# Patient Record
Sex: Male | Born: 1945 | Race: White | Hispanic: No | Marital: Single | Smoking: Former smoker
Health system: Southern US, Community
[De-identification: ages and names within clinical notes are randomized; demographics above are authoritative.]

## PROBLEM LIST (undated history)

## (undated) DIAGNOSIS — M1990 Unspecified osteoarthritis, unspecified site: Secondary | ICD-10-CM

## (undated) DIAGNOSIS — I2510 Atherosclerotic heart disease of native coronary artery without angina pectoris: Secondary | ICD-10-CM

## (undated) DIAGNOSIS — E785 Hyperlipidemia, unspecified: Secondary | ICD-10-CM

## (undated) DIAGNOSIS — E669 Obesity, unspecified: Secondary | ICD-10-CM

## (undated) DIAGNOSIS — E119 Type 2 diabetes mellitus without complications: Secondary | ICD-10-CM

## (undated) DIAGNOSIS — I639 Cerebral infarction, unspecified: Secondary | ICD-10-CM

## (undated) DIAGNOSIS — I10 Essential (primary) hypertension: Secondary | ICD-10-CM

## (undated) DIAGNOSIS — I219 Acute myocardial infarction, unspecified: Secondary | ICD-10-CM

## (undated) DIAGNOSIS — G4730 Sleep apnea, unspecified: Secondary | ICD-10-CM

## (undated) HISTORY — DX: Essential (primary) hypertension: I10

## (undated) HISTORY — PX: HERNIA REPAIR: SHX51

## (undated) HISTORY — PX: KNEE SURGERY: SHX244

## (undated) HISTORY — DX: Atherosclerotic heart disease of native coronary artery without angina pectoris: I25.10

## (undated) HISTORY — DX: Type 2 diabetes mellitus without complications: E11.9

## (undated) HISTORY — DX: Hyperlipidemia, unspecified: E78.5

## (undated) HISTORY — PX: SHOULDER SURGERY: SHX246

## (undated) HISTORY — DX: Obesity, unspecified: E66.9

---

## 2012-05-04 ENCOUNTER — Encounter: Payer: Self-pay | Admitting: Cardiovascular Disease

## 2012-05-04 ENCOUNTER — Encounter: Payer: Self-pay | Admitting: *Deleted

## 2012-05-07 ENCOUNTER — Ambulatory Visit (INDEPENDENT_AMBULATORY_CARE_PROVIDER_SITE_OTHER): Payer: Medicare Other | Admitting: Cardiovascular Disease

## 2012-05-07 ENCOUNTER — Encounter: Payer: Self-pay | Admitting: Cardiovascular Disease

## 2012-05-07 VITALS — BP 174/82 | HR 66 | Wt 249.0 lb

## 2012-05-07 DIAGNOSIS — E782 Mixed hyperlipidemia: Secondary | ICD-10-CM

## 2012-05-07 DIAGNOSIS — E119 Type 2 diabetes mellitus without complications: Secondary | ICD-10-CM | POA: Insufficient documentation

## 2012-05-07 DIAGNOSIS — E669 Obesity, unspecified: Secondary | ICD-10-CM | POA: Insufficient documentation

## 2012-05-07 DIAGNOSIS — I2510 Atherosclerotic heart disease of native coronary artery without angina pectoris: Secondary | ICD-10-CM | POA: Insufficient documentation

## 2012-05-07 DIAGNOSIS — I10 Essential (primary) hypertension: Secondary | ICD-10-CM | POA: Insufficient documentation

## 2012-05-07 NOTE — Progress Notes (Signed)
Patient ID: Larry Hodges, male   DOB: 02/09/1946, 65 y.o.   MRN: [id number] 65 yo referred by [facility] for cardiology F/U.  CRF;s include HTN, DM, elevated lipids.  Larry York NP and [name] MD History of moderate CAD by cath 2005 in [place].  NO stent or PCI.  No chest pain but has diaphoresis and exertional dyspnea.  Last ETT was 2007 he indicates ok.  DM poorly controlled. Diet with too much bread.  Sedentary with no exercise.  Does some yard work.  Compliant with meds 88 pack year smoking history and quit in 2005.  Last ortho surgery 2008 left rotator cuff with no cardiac complications.  His new patient form indicates reaction to dye but did not have any problem at cath and was not premedicated   ROS: Denies fever, malais, weight loss, blurry vision, decreased visual acuity, cough, sputum, SOB, hemoptysis, pleuritic pain, palpitaitons, heartburn, abdominal pain, melena, lower extremity edema, claudication, or rash.  All other systems reviewed and negative   General: Affect appropriate Obese white male HEENT: normal Neck supple with no adenopathy JVP normal no bruits no thyromegaly Lungs clear with no wheezing and good diaphragmatic motion Heart:  S1/S2 no murmur,rub, gallop or click PMI normal Abdomen: benighn, BS positve, no tenderness, no AAA no bruit.  No HSM or HJR Distal pulses intact with no bruits No edema Neuro non-focal Skin warm and dry No muscular weakness  Medications Current Outpatient Prescriptions  Medication Sig Dispense Refill  . aspirin 81 MG tablet Take 81 mg by mouth daily.      . clopidogrel (PLAVIX) 75 MG tablet Take 75 mg by mouth daily.      . pravastatin (PRAVACHOL) 40 MG tablet Take 40 mg by mouth daily.        Allergies Review of patient's allergies indicates no known allergies.  Family History: Family History  Problem Relation Age of Onset  . Hypertension    . Kidney disease    . Hypertension Mother   . Kidney disease  Father     Social History: History   Social History  . Marital Status: Single    Spouse Name: N/A    Number of Children: N/A  . Years of Education: N/A   Occupational History  . Not on file.   Social History Main Topics  . Smoking status: Former Smoker    Types: Cigarettes    Quit date: 12/01/2003  . Smokeless tobacco: Never Used  . Alcohol Use: Yes  . Drug Use:   . Sexually Active:    Other Topics Concern  . Not on file   Social History Narrative  . No narrative on file    Electrocardiogram: NSR rate 66 LAD IVCD no old tracing  Assessment and Plan   

## 2012-05-07 NOTE — Assessment & Plan Note (Signed)
Well controlled.  Continue current medications and low sodium Dash type diet.    

## 2012-05-07 NOTE — Assessment & Plan Note (Signed)
Multiple CRF;s with DM and exertinal diaphoresis and dyspnea.  Moderate CAD in 2005.  Needs stress test to begin exercise program Given IVCD on ECG wll need imaging  Stress myovue may need combo of Lexiscan and low level exercise.  Hold beta blocker before scan

## 2012-05-07 NOTE — Assessment & Plan Note (Signed)
Cholesterol is at goal.  Continue current dose of statin and diet Rx.  No myalgias or side effects.  F/U  LFT's in 6 months. No results found for this basename: LDLCALC             

## 2012-05-07 NOTE — Patient Instructions (Signed)
Your physician wants you to follow-up in: 6 MONTHS WITH DR NISHAN You will receive a reminder letter in the mail two months in advance. If you don't receive a letter, please call our office to schedule the follow-up appointment. Your physician recommends that you continue on your current medications as directed. Please refer to the Current Medication list given to you today. Your physician has requested that you have en exercise stress myoview. For further information please visit [email or web address]. Please follow instruction sheet, as given. DX CAD 

## 2012-05-07 NOTE — Assessment & Plan Note (Signed)
Long discussion regarding lifestyle diet and need to exercise.  Also discussed bariatric surgery.  Will see what stress test looks like and what his weight dose in 6 months

## 2012-05-07 NOTE — Assessment & Plan Note (Signed)
Discussed low carb diet.  Target hemoglobin A1c is 6.5 or less.  Continue current medications.  

## 2012-05-10 ENCOUNTER — Encounter: Payer: Self-pay | Admitting: Cardiovascular Disease

## 2012-05-14 ENCOUNTER — Ambulatory Visit (HOSPITAL_COMMUNITY): Payer: Medicare Other | Attending: Cardiovascular Disease | Admitting: Radiology

## 2012-05-14 VITALS — BP 136/81 | HR 65 | Ht 67.0 in | Wt 249.0 lb

## 2012-05-14 DIAGNOSIS — R9439 Abnormal result of other cardiovascular function study: Secondary | ICD-10-CM | POA: Insufficient documentation

## 2012-05-14 DIAGNOSIS — R0602 Shortness of breath: Secondary | ICD-10-CM

## 2012-05-14 DIAGNOSIS — I779 Disorder of arteries and arterioles, unspecified: Secondary | ICD-10-CM | POA: Insufficient documentation

## 2012-05-14 DIAGNOSIS — Z87891 Personal history of nicotine dependence: Secondary | ICD-10-CM | POA: Insufficient documentation

## 2012-05-14 DIAGNOSIS — E119 Type 2 diabetes mellitus without complications: Secondary | ICD-10-CM

## 2012-05-14 DIAGNOSIS — I10 Essential (primary) hypertension: Secondary | ICD-10-CM

## 2012-05-14 DIAGNOSIS — E669 Obesity, unspecified: Secondary | ICD-10-CM | POA: Insufficient documentation

## 2012-05-14 DIAGNOSIS — I2510 Atherosclerotic heart disease of native coronary artery without angina pectoris: Secondary | ICD-10-CM

## 2012-05-14 DIAGNOSIS — Z8249 Family history of ischemic heart disease and other diseases of the circulatory system: Secondary | ICD-10-CM | POA: Insufficient documentation

## 2012-05-14 DIAGNOSIS — E785 Hyperlipidemia, unspecified: Secondary | ICD-10-CM | POA: Insufficient documentation

## 2012-05-14 DIAGNOSIS — R0989 Other specified symptoms and signs involving the circulatory and respiratory systems: Secondary | ICD-10-CM | POA: Insufficient documentation

## 2012-05-14 DIAGNOSIS — E782 Mixed hyperlipidemia: Secondary | ICD-10-CM

## 2012-05-14 DIAGNOSIS — R0609 Other forms of dyspnea: Secondary | ICD-10-CM | POA: Insufficient documentation

## 2012-05-14 DIAGNOSIS — Z8673 Personal history of transient ischemic attack (TIA), and cerebral infarction without residual deficits: Secondary | ICD-10-CM | POA: Insufficient documentation

## 2012-05-14 MED ORDER — TECHNETIUM TC 99M TETROFOSMIN IV KIT
33.0000 | PACK | Freq: Once | INTRAVENOUS | Status: AC | PRN
  Administered 2012-05-14: 33 via INTRAVENOUS

## 2012-05-14 MED ORDER — TECHNETIUM TC 99M TETROFOSMIN IV KIT
11.0000 | PACK | Freq: Once | INTRAVENOUS | Status: AC | PRN
  Administered 2012-05-14: 11 via INTRAVENOUS

## 2012-05-14 NOTE — Progress Notes (Signed)
[facility] SITE 3 NUCLEAR MED [address] [place] [address] [phone]  Cardiology Nuclear Med Study  Larry Hodges is a 66 y.o. male     MRN : [id number]     DOB: [birth year]  Procedure Date: 05/14/2012  Nuclear Med Background Indication for Stress Test:  Evaluation for Ischemia History:  '05 MI>Cath([place]):Moderate N/O CAD; '07 MPS([place]):Normal, per patient Cardiac Risk Factors: Carotid Disease (per patient-slight on left), Family History - CAD, History of Smoking, Hypertension, Lipids, NIDDM, TIA (per patient) and Obesity  Symptoms:  DOE   Nuclear Pre-Procedure Caffeine/Decaff Intake:  None NPO After: 10:00pm   Lungs:  Clear. O2 Sat: 98% on room air. IV 0.9% NS with Angio Cath:  20g  IV Site: R Wrist  IV Started by:  Stanton Kidney, EMT-P  Chest Size (in):  54 Cup Size: n/a  Height: 5\' 7"  (1.702 m)  Weight:  249 lb (112.946 kg)  BMI:  Body mass index is 39.00 kg/(m^2). Tech Comments:  Metoprolol held > 48 hours, per patient. CBG= 157 @ 7am, per patient.    Nuclear Med Study 1 or 2 day study: 1 day  Stress Test Type:  Stress  Reading MD: Olga Millers, MD  Order Authorizing Provider:  [name], MD  Resting Radionuclide: Technetium 12m Tetrofosmin  Resting Radionuclide Dose: 10.6 mCi   Stress Radionuclide:  Technetium 43m Tetrofosmin  Stress Radionuclide Dose: 32.1 mCi           Stress Protocol Rest HR: 65 Stress HR: 125  Rest BP: 136/81 Stress BP: 197/56  Exercise Time (min): 4:39 METS: 5.9   Predicted Max HR: 156 bpm % Max HR: 80.13 bpm Rate Pressure Product: 46962   Dose of Adenosine (mg):  n/a Dose of Lexiscan: n/a mg  Dose of Atropine (mg): n/a Dose of Dobutamine: n/a mcg/kg/min (at max HR)  Stress Test Technologist: Smiley Houseman, CMA-N  Nuclear Technologist:  Domenic Polite, CNMT     Rest Procedure:  Myocardial perfusion imaging was performed at rest 45 minutes following the intravenous administration of Technetium 71m  Tetrofosmin.  Rest ECG: NSIVCD with nonspecific T-wave changes.  Stress Procedure:  The patient exercised on the treadmill utilizing the Bruce protocol for 4:39 minutes. He then stopped due to fatigue and dyspnea with peak O2 sat of 91%.  He denied any chest pain.  There were no diagnostic ST-T wave changes, occasional PAC's/PVC's were noted.  Technetium 44m Tetrofosmin was injected at peak exercise and myocardial perfusion imaging was performed after a brief delay.  Stress ECG: No significant ST segment change suggestive of ischemia.  QPS Raw Data Images:  Acquisition technically good; mild LVE. Stress Images:  There is decreased uptake in the inferior wall and apex. Rest Images:  There is decreased uptake in the inferior wall and apex; inferior defect less prominent compared to the stress images. Subtraction (SDS):  These findings are consistent with prior inferior and apical infarct and mild ischemia in the inferior wall. Transient Ischemic Dilatation (Normal <1.22):  0.98 Lung/Heart Ratio (Normal <0.45):  0.42  Quantitative Gated Spect Images QGS EDV:  120 ml QGS ESV:  58 ml  Impression Exercise Capacity:  Poor exercise capacity. BP Response:  Normal blood pressure response. Clinical Symptoms:  There is dyspnea. ECG Impression:  No significant ST segment change suggestive of ischemia. Comparison with Prior Nuclear Study: No images to compare  Overall Impression:  Abnormal stress nuclear study with a medium size, moderate intensity, partially reversible inferior and apical  defect consistent with prior inferior and apical infarct and mild peri-infarct ischemia in the inferior wall.  LV Ejection Fraction: 52%.  LV Wall Motion:  Normal Wall Motion  Olga Millers

## 2012-05-17 NOTE — Patient Instructions (Addendum)
Pt was scheduled for 6/28

## 2012-05-18 ENCOUNTER — Other Ambulatory Visit: Payer: Self-pay | Admitting: *Deleted

## 2012-05-18 ENCOUNTER — Telehealth: Payer: Self-pay | Admitting: Cardiovascular Disease

## 2012-05-18 ENCOUNTER — Encounter: Payer: Self-pay | Admitting: *Deleted

## 2012-05-18 DIAGNOSIS — I10 Essential (primary) hypertension: Secondary | ICD-10-CM

## 2012-05-18 DIAGNOSIS — Z01810 Encounter for preprocedural cardiovascular examination: Secondary | ICD-10-CM

## 2012-05-18 DIAGNOSIS — I2510 Atherosclerotic heart disease of native coronary artery without angina pectoris: Secondary | ICD-10-CM

## 2012-05-18 MED ORDER — FAMOTIDINE 10 MG PO CHEW: 10.0000 mg | CHEWABLE_TABLET | ORAL | Status: DC

## 2012-05-18 MED ORDER — PREDNISONE 20 MG PO TABS: 20.0000 mg | ORAL_TABLET | Freq: Every day | ORAL | Status: DC

## 2012-05-18 MED ORDER — PREDNISONE 20 MG PO TABS: 20.0000 mg | ORAL_TABLET | Freq: Every day | ORAL | Status: AC

## 2012-05-18 NOTE — Telephone Encounter (Signed)
PT AWARE OF TEST RESULTS  CATH SET UP FOR 05-25-12 AT 8:30 WITH DR NISHAN./CY

## 2012-05-18 NOTE — Telephone Encounter (Signed)
New problem:  Patient calling regarding test results.  

## 2012-05-20 ENCOUNTER — Other Ambulatory Visit: Payer: Self-pay | Admitting: Cardiovascular Disease

## 2012-05-21 ENCOUNTER — Other Ambulatory Visit: Payer: Medicare Other

## 2012-05-21 ENCOUNTER — Other Ambulatory Visit (INDEPENDENT_AMBULATORY_CARE_PROVIDER_SITE_OTHER): Payer: Medicare Other

## 2012-05-21 DIAGNOSIS — I2510 Atherosclerotic heart disease of native coronary artery without angina pectoris: Secondary | ICD-10-CM

## 2012-05-21 DIAGNOSIS — Z01810 Encounter for preprocedural cardiovascular examination: Secondary | ICD-10-CM

## 2012-05-21 DIAGNOSIS — I10 Essential (primary) hypertension: Secondary | ICD-10-CM

## 2012-05-21 LAB — CBC WITH DIFFERENTIAL/PLATELET
Basophils Relative: 0.8 % (ref 0.0–3.0)
Eosinophils Absolute: 0.3 10*3/uL (ref 0.0–0.7)
HCT: 41.3 % (ref 39.0–52.0)
Hemoglobin: 13.4 g/dL (ref 13.0–17.0)
Lymphs Abs: 1.9 10*3/uL (ref 0.7–4.0)
MCHC: 32.3 g/dL (ref 30.0–36.0)
MCV: 92.3 fl (ref 78.0–100.0)
Monocytes Absolute: 0.5 10*3/uL (ref 0.1–1.0)
Neutro Abs: 6 10*3/uL (ref 1.4–7.7)
Neutrophils Relative %: 68.6 % (ref 43.0–77.0)
RBC: 4.47 Mil/uL (ref 4.22–5.81)

## 2012-05-21 LAB — PROTIME-INR: INR: 0.9 ratio (ref 0.8–1.0)

## 2012-05-21 LAB — BASIC METABOLIC PANEL
CO2: 27 mEq/L (ref 19–32)
Chloride: 103 mEq/L (ref 96–112)
Creatinine, Ser: 1 mg/dL (ref 0.4–1.5)

## 2012-05-25 ENCOUNTER — Inpatient Hospital Stay (HOSPITAL_BASED_OUTPATIENT_CLINIC_OR_DEPARTMENT_OTHER)
Admission: RE | Admit: 2012-05-25 | Discharge: 2012-05-25 | Disposition: A | Discharge status: Home / Self Care | Payer: Medicare Other | Source: Ambulatory Visit | Attending: Cardiovascular Disease | Admitting: Cardiovascular Disease

## 2012-05-25 ENCOUNTER — Encounter (HOSPITAL_BASED_OUTPATIENT_CLINIC_OR_DEPARTMENT_OTHER)
Admission: RE | Disposition: A | Discharge status: Home / Self Care | Payer: Self-pay | Source: Ambulatory Visit | Attending: Cardiovascular Disease

## 2012-05-25 DIAGNOSIS — E785 Hyperlipidemia, unspecified: Secondary | ICD-10-CM | POA: Insufficient documentation

## 2012-05-25 DIAGNOSIS — I2510 Atherosclerotic heart disease of native coronary artery without angina pectoris: Secondary | ICD-10-CM

## 2012-05-25 DIAGNOSIS — R9439 Abnormal result of other cardiovascular function study: Secondary | ICD-10-CM | POA: Insufficient documentation

## 2012-05-25 DIAGNOSIS — Q245 Malformation of coronary vessels: Secondary | ICD-10-CM | POA: Insufficient documentation

## 2012-05-25 DIAGNOSIS — I10 Essential (primary) hypertension: Secondary | ICD-10-CM | POA: Insufficient documentation

## 2012-05-25 DIAGNOSIS — E119 Type 2 diabetes mellitus without complications: Secondary | ICD-10-CM | POA: Insufficient documentation

## 2012-05-25 LAB — POCT I-STAT GLUCOSE: Glucose, Bld: 162 mg/dL — ABNORMAL HIGH (ref 70–99)

## 2012-05-25 SURGERY — JV LEFT HEART CATHETERIZATION WITH CORONARY ANGIOGRAM
Anesthesia: Moderate Sedation

## 2012-05-25 MED ORDER — SODIUM CHLORIDE 0.9 % IJ SOLN: 3.0000 mL | INTRAMUSCULAR | Status: DC | PRN

## 2012-05-25 MED ORDER — SODIUM CHLORIDE 0.9 % IJ SOLN: 3.0000 mL | Freq: Two times a day (BID) | INTRAMUSCULAR | Status: DC

## 2012-05-25 MED ORDER — ASPIRIN 81 MG PO CHEW: 324.0000 mg | CHEWABLE_TABLET | ORAL | Status: DC

## 2012-05-25 MED ORDER — SODIUM CHLORIDE 0.45 % IV SOLN: INTRAVENOUS | Status: AC

## 2012-05-25 MED ORDER — SODIUM CHLORIDE 0.9 % IV SOLN: 250.0000 mL | INTRAVENOUS | Status: DC

## 2012-05-25 MED ORDER — SODIUM CHLORIDE 0.9 % IV SOLN
250.0000 mL | INTRAVENOUS | Status: DC | PRN
  Administered 2012-05-25: 250 mL via INTRAVENOUS

## 2012-05-25 MED ORDER — OXYCODONE-ACETAMINOPHEN 5-325 MG PO TABS
1.0000 | ORAL_TABLET | ORAL | Status: DC | PRN
Start: 2012-05-25 — End: 2012-05-25

## 2012-05-25 MED ORDER — ASPIRIN 325 MG PO TABS
325.0000 mg | ORAL_TABLET | Freq: Once | ORAL | Status: AC
  Administered 2012-05-25: 325 mg via ORAL

## 2012-05-25 MED ORDER — ISOSORBIDE MONONITRATE ER 30 MG PO TB24: 30.0000 mg | ORAL_TABLET | Freq: Every day | ORAL | Status: DC

## 2012-05-25 MED ORDER — ACETAMINOPHEN 325 MG PO TABS: 650.0000 mg | ORAL_TABLET | ORAL | Status: DC | PRN

## 2012-05-25 MED ORDER — ASPIRIN EC 325 MG PO TBEC: 325.0000 mg | DELAYED_RELEASE_TABLET | Freq: Every day | ORAL | Status: DC

## 2012-05-25 MED ORDER — ONDANSETRON HCL 4 MG/2ML IJ SOLN: 4.0000 mg | Freq: Four times a day (QID) | INTRAMUSCULAR | Status: DC | PRN

## 2012-05-25 NOTE — H&P (View-Only) (Signed)
Patient ID: Larry Hodges, male   DOB: 01-23-1946, 66 y.o.   MRN: [id number] 66 yo referred by [facility] for cardiology F/U.  CRF;s include HTN, DM, elevated lipids.  Mauricio Po NP and Marcellus Scott MD History of moderate CAD by cath 2005 in [place].  NO stent or PCI.  No chest pain but has diaphoresis and exertional dyspnea.  Last ETT was 2007 he indicates ok.  DM poorly controlled. Diet with too much bread.  Sedentary with no exercise.  Does some yard work.  Compliant with meds 88 pack year smoking history and quit in 2005.  Last ortho surgery 2008 left rotator cuff with no cardiac complications.  His new patient form indicates reaction to dye but did not have any problem at cath and was not premedicated   ROS: Denies fever, malais, weight loss, blurry vision, decreased visual acuity, cough, sputum, SOB, hemoptysis, pleuritic pain, palpitaitons, heartburn, abdominal pain, melena, lower extremity edema, claudication, or rash.  All other systems reviewed and negative   General: Affect appropriate Obese white male HEENT: normal Neck supple with no adenopathy JVP normal no bruits no thyromegaly Lungs clear with no wheezing and good diaphragmatic motion Heart:  S1/S2 no murmur,rub, gallop or click PMI normal Abdomen: benighn, BS positve, no tenderness, no AAA no bruit.  No HSM or HJR Distal pulses intact with no bruits No edema Neuro non-focal Skin warm and dry No muscular weakness  Medications Current Outpatient Prescriptions  Medication Sig Dispense Refill  . aspirin 81 MG tablet Take 81 mg by mouth daily.      . clopidogrel (PLAVIX) 75 MG tablet Take 75 mg by mouth daily.      . pravastatin (PRAVACHOL) 40 MG tablet Take 40 mg by mouth daily.        Allergies Review of patient's allergies indicates no known allergies.  Family History: Family History  Problem Relation Age of Onset  . Hypertension    . Kidney disease    . Hypertension Mother   . Kidney disease  Father     Social History: History   Social History  . Marital Status: Single    Spouse Name: N/A    Number of Children: N/A  . Years of Education: N/A   Occupational History  . Not on file.   Social History Main Topics  . Smoking status: Former Smoker    Types: Cigarettes    Quit date: 12/01/2003  . Smokeless tobacco: Never Used  . Alcohol Use: Yes  . Drug Use:   . Sexually Active:    Other Topics Concern  . Not on file   Social History Narrative  . No narrative on file    Electrocardiogram: NSR rate 66 LAD IVCD no old tracing  Assessment and Plan

## 2012-05-25 NOTE — Interval H&P Note (Signed)
History and Physical Interval Note:  05/25/2012 8:03 AM  Larry Hodges  has presented today for surgery, with the diagnosis of chest pain  The various methods of treatment have been discussed with the patient and family. After consideration of risks, benefits and other options for treatment, the patient has consented to  Procedure(s) (LRB): JV LEFT HEART CATHETERIZATION WITH CORONARY ANGIOGRAM (N/A) as a surgical intervention .  The patient's history has been reviewed, patient examined, no change in status, stable for surgery.  I have reviewed the patients' chart and labs.  Questions were answered to the patient's satisfaction.     [name]

## 2012-05-25 NOTE — CV Procedure (Signed)
      Catheterization   Indication: Diabetic with abnormal myovue  Procedure: After informed consent and clinical "time out" the right groin was prepped and draped in a sterile fashion.  A 5Fr sheath was placed in the right femoral artery using seldinger technique and local lidocaine.  Standard JL4, JR4 and angled pigtail catheters were used to engage the coronary arteries.  Coronary arteries were visualized in orthogonal views using caudal and cranial angulation.  RAO ventriculography was done using 28* cc of contrast.    Medications:   Versed: 2 mg's  Fentanyl: 0 ug's  Coronary Arteries: Right dominant with no anomalies  LM: 30% mid lesion  LAD: 30-40% ostial.  Long mid/distal segment of ? Myocardial bridging.  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  Medical Rx  Better control of DM And add nitrates.  [name] 05/25/2012 9:01 AM

## 2012-05-25 NOTE — Discharge Instructions (Signed)
F/U with Dr Eden Emms 8 weeks Start Imdur 30 mg daily Script called in start over weekend

## 2012-06-06 ENCOUNTER — Encounter: Payer: Medicare Other | Admitting: Physician Assistant

## 2012-06-14 ENCOUNTER — Encounter: Payer: Medicare Other | Admitting: Physician Assistant

## 2012-06-19 ENCOUNTER — Ambulatory Visit (INDEPENDENT_AMBULATORY_CARE_PROVIDER_SITE_OTHER): Payer: Medicare Other | Admitting: Physician Assistant

## 2012-06-19 ENCOUNTER — Encounter: Payer: Self-pay | Admitting: Physician Assistant

## 2012-06-19 ENCOUNTER — Other Ambulatory Visit: Payer: Self-pay | Admitting: *Deleted

## 2012-06-19 VITALS — BP 107/51 | HR 57 | Ht 67.0 in | Wt 248.0 lb

## 2012-06-19 DIAGNOSIS — I10 Essential (primary) hypertension: Secondary | ICD-10-CM

## 2012-06-19 DIAGNOSIS — I2510 Atherosclerotic heart disease of native coronary artery without angina pectoris: Secondary | ICD-10-CM

## 2012-06-19 DIAGNOSIS — E785 Hyperlipidemia, unspecified: Secondary | ICD-10-CM

## 2012-06-19 DIAGNOSIS — E669 Obesity, unspecified: Secondary | ICD-10-CM

## 2012-06-19 DIAGNOSIS — E119 Type 2 diabetes mellitus without complications: Secondary | ICD-10-CM

## 2012-06-19 NOTE — Progress Notes (Signed)
[address] [place], [place]  [address] Phone: [phone] Fax:  [phone]  Date:  06/19/2012   Name:  Larry Hodges   DOB:  May 11, 1946   MRN:  [id number]  PCP:  Dema Severin, NP  Primary Cardiologist:  Dr. [name]  Primary Electrophysiologist:  None    History of Present Illness: Larry Hodges is a 66 y.o. male who returns for post cath follow up.  He has a history of DM2, HTN, HL, CAD.  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  He establish with Dr. Eden Emms 05/07/12.  He denied chest pain but did have some exertional dyspnea.  Follow up Myoview performed 05/14/12 was abnormal: inferior and apical defect consistent with prior inferior and apical infarct and mild peri-infarct ischemia, EF 52%.  Therefore, cardiac catheterization was recommended.  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  Dr. Eden Emms added isosorbide 30 mg daily given his myocardial bridging and evidence of diffuse distal diabetic disease on his cardiac catheterization.  The patient denies chest pain, shortness of breath, syncope, orthopnea, PND or significant pedal edema.  Tolerating isosorbide well.    Past Medical History  Diagnosis Date  . DM (diabetes mellitus)   . HTN (hypertension)   . Hyperlipidemia   . CAD (coronary artery disease)     remote MI 2005 in [place] - no PCI;  Myoview performed 05/14/12 was abnormal: inferior and apical defect consistent with prior inferior and apical infarct and mild peri-infarct ischemia, EF 52%.  Therefore, cardiac catheterization was recommended.  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 -  med Rx  . Obesity     Current Outpatient Prescriptions  Medication Sig Dispense Refill  . aspirin 81 MG  tablet Take 81 mg by mouth daily.      . clopidogrel (PLAVIX) 75 MG tablet Take 75 mg by mouth daily.      . hydrochlorothiazide (MICROZIDE) 12.5 MG capsule Take 12.5 mg by mouth daily.      . isosorbide mononitrate (IMDUR) 30 MG 24 hr tablet Take 1 tablet (30 mg total) by mouth daily.  30 tablet  11  . lisinopril (PRINIVIL,ZESTRIL) 20 MG tablet Take 20 mg by mouth daily.      . metFORMIN (GLUCOPHAGE) 1000 MG tablet Take 1,000 mg by mouth 2 (two) times daily with a meal.      . metoprolol (LOPRESSOR) 50 MG tablet Take 50 mg by mouth 2 (two) times daily.      . pravastatin (PRAVACHOL) 40 MG tablet Take 40 mg by mouth daily.        Allergies: No Known Allergies  History  Substance Use Topics  . Smoking status: Former Smoker    Types: Cigarettes    Quit date: 12/01/2003  . Smokeless tobacco: Never Used  . Alcohol Use: Yes     PHYSICAL EXAM: VS:  BP 107/51  Pulse 57  Ht 5\' 7"  (1.702 m)  Wt 248 lb (112.492 kg)  BMI 38.84 kg/m2 Well nourished, well developed, in no acute distress HEENT: normal Neck: no JVD Cardiac:  normal S1, S2; RRR; no murmur Lungs:  clear to auscultation bilaterally, no wheezing, rhonchi or rales Abd: soft, nontender, no hepatomegaly Ext: trace bilateral  LE edema; right groin without hematoma or bruit  Skin: warm and dry Neuro:  CNs 2-12 intact, no focal abnormalities noted  EKG:  Sinus rhythm, heart rate 60, left axis deviation, interventricular conduction delay, nonspecific ST-T wave changes, no change from prior tracing      ASSESSMENT AND PLAN:  1.  CAD Nonobstructive by cardiac catheterization.  He did have evidence of myocardial bridging of the mid to distal LAD as well as evidence of diffuse distal diabetic disease especially in the LAD which was small caliber.  He is tolerating isosorbide.  Continue aspirin and Plavix.  Continue to increase activity and monitor diabetes and hyperlipidemia with his PCP.  Follow up with Dr. Eden Emms in 6 months or sooner  as needed.  2.  Hypertension Controlled.  Continue current therapy.   3.  Hyperlipidemia Managed by PCP. Goal LDL </= 70.  4.  Diabetes mellitus Continued strict control per PCP.  5.  Obesity We discussed various options to lose weight and increase activity.    Signed, Tereso Newcomer, PA-C  1:13 PM 06/19/2012

## 2012-06-19 NOTE — Patient Instructions (Addendum)
Your physician wants you to follow-up in: 6 MONTHS WITH DR. NISHAN. You will receive a reminder letter in the mail two months in advance. If you don't receive a letter, please call our office to schedule the follow-up appointment.   NO CHANGES WERE MADE TODAY 

## 2012-11-06 ENCOUNTER — Ambulatory Visit (INDEPENDENT_AMBULATORY_CARE_PROVIDER_SITE_OTHER): Payer: Medicare Other | Admitting: Cardiovascular Disease

## 2012-11-06 ENCOUNTER — Encounter: Payer: Self-pay | Admitting: Cardiovascular Disease

## 2012-11-06 VITALS — BP 128/54 | HR 63 | Resp 16 | Ht 67.0 in | Wt 247.8 lb

## 2012-11-06 DIAGNOSIS — I10 Essential (primary) hypertension: Secondary | ICD-10-CM

## 2012-11-06 DIAGNOSIS — E782 Mixed hyperlipidemia: Secondary | ICD-10-CM

## 2012-11-06 DIAGNOSIS — E119 Type 2 diabetes mellitus without complications: Secondary | ICD-10-CM

## 2012-11-06 DIAGNOSIS — I2510 Atherosclerotic heart disease of native coronary artery without angina pectoris: Secondary | ICD-10-CM

## 2012-11-06 MED ORDER — NITROGLYCERIN 0.4 MG SL SUBL: 0.4000 mg | SUBLINGUAL_TABLET | SUBLINGUAL | Status: DC | PRN

## 2012-11-06 NOTE — Assessment & Plan Note (Signed)
Discussed low carb diet.  Target hemoglobin A1c is 6.5 or less.  Continue current medications.  

## 2012-11-06 NOTE — Patient Instructions (Signed)
Your physician wants you to follow-up in:  6 MONTHS WITH DR NISHAN  You will receive a reminder letter in the mail two months in advance. If you don't receive a letter, please call our office to schedule the follow-up appointment. Your physician recommends that you continue on your current medications as directed. Please refer to the Current Medication list given to you today. 

## 2012-11-06 NOTE — Assessment & Plan Note (Signed)
Cholesterol is at goal.  Continue current dose of statin and diet Rx.  No myalgias or side effects.  F/U  LFT's in 6 months. No results found for this basename: LDLCALC   Labs with primary continue statin

## 2012-11-06 NOTE — Progress Notes (Signed)
Patient ID: Larry Hodges, male   DOB: 10-30-1946, 66 y.o.   MRN: [id number] Larry Hodges is a 66 y.o. male who returns for post cath follow up.  He has a history of DM2, HTN, HL, CAD. 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 He establish with Dr. Eden Emms 05/07/12. He denied chest pain but did have some exertional dyspnea. Follow up Myoview performed 05/14/12 was abnormal: inferior and apical defect consistent with prior inferior and apical infarct and mild peri-infarct ischemia, EF 52%. Therefore, cardiac catheterization was recommended. 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 Isosorbide 30 mg daily given for  his myocardial bridging and evidence of diffuse distal diabetic disease on his cardiac catheterization.   The patient denies chest pain, shortness of breath, syncope, orthopnea, PND or significant pedal edema. Tolerating isosorbide well.  A1C about 6.5  Needs SL nitro  ROS: Denies fever, malais, weight loss, blurry vision, decreased visual acuity, cough, sputum, SOB, hemoptysis, pleuritic pain, palpitaitons, heartburn, abdominal pain, melena, lower extremity edema, claudication, or rash.  All other systems reviewed and negative  General: Affect appropriate Overweight white male HEENT: normal Neck supple with no adenopathy JVP normal no bruits no thyromegaly Lungs clear with no wheezing and good diaphragmatic motion Heart:  S1/S2 no murmur, no rub, gallop or click PMI normal Abdomen: benighn, BS positve, no tenderness, no AAA no bruit.  No HSM or HJR Distal pulses intact with no bruits Trace bilateral edema Neuro non-focal Skin warm and dry No muscular weakness   Current Outpatient Prescriptions  Medication Sig Dispense Refill  . aspirin 81 MG tablet Take 81 mg by mouth daily.      . clopidogrel (PLAVIX) 75 MG tablet Take 75  mg by mouth daily.      . hydrochlorothiazide (MICROZIDE) 12.5 MG capsule Take 12.5 mg by mouth daily.      . isosorbide mononitrate (IMDUR) 30 MG 24 hr tablet Take 1 tablet (30 mg total) by mouth daily.  30 tablet  11  . lisinopril (PRINIVIL,ZESTRIL) 20 MG tablet Take 30 mg by mouth daily.       . metFORMIN (GLUCOPHAGE) 1000 MG tablet Take 1,000 mg by mouth 2 (two) times daily with a meal.      . metoprolol (LOPRESSOR) 50 MG tablet Take 50 mg by mouth 2 (two) times daily.      . pravastatin (PRAVACHOL) 40 MG tablet Take 40 mg by mouth daily.        Allergies  Review of patient's allergies indicates no known allergies.  Electrocardiogram: 7/23 SR rate 60 LVH   Assessment and Plan

## 2012-11-06 NOTE — Assessment & Plan Note (Signed)
Stable with no angina and good activity level.  Continue medical Rx  

## 2012-11-06 NOTE — Assessment & Plan Note (Signed)
Well controlled.  Continue current medications and low sodium Dash type diet.    

## 2013-05-13 ENCOUNTER — Encounter: Payer: Self-pay | Admitting: Cardiovascular Disease

## 2013-07-09 ENCOUNTER — Encounter: Payer: Self-pay | Admitting: Cardiovascular Disease

## 2013-07-09 ENCOUNTER — Ambulatory Visit (INDEPENDENT_AMBULATORY_CARE_PROVIDER_SITE_OTHER): Payer: Medicare Other | Admitting: Cardiovascular Disease

## 2013-07-09 VITALS — BP 138/70 | HR 62 | Wt 240.0 lb

## 2013-07-09 DIAGNOSIS — I2510 Atherosclerotic heart disease of native coronary artery without angina pectoris: Secondary | ICD-10-CM

## 2013-07-09 DIAGNOSIS — R0989 Other specified symptoms and signs involving the circulatory and respiratory systems: Secondary | ICD-10-CM

## 2013-07-09 NOTE — Assessment & Plan Note (Signed)
Stable with no angina and good activity level.  Continue medical Rx  

## 2013-07-09 NOTE — Assessment & Plan Note (Signed)
Well controlled.  Continue current medications and low sodium Dash type diet.    

## 2013-07-09 NOTE — Assessment & Plan Note (Signed)
Discussed low carb diet.  Target hemoglobin A1c is 6.5 or less.  Continue current medications.  

## 2013-07-09 NOTE — Progress Notes (Signed)
Patient ID: Larry Hodges, male   DOB: 04/18/1946, 67 y.o.   MRN: [id number] Larry Hodges is a 67 y.o. male who returns for post cath follow up.  He has a history of DM2, HTN, HL, CAD. 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 He establish with Dr. Eden Emms 05/07/12. He denied chest pain but did have some exertional dyspnea. Follow up Myoview performed 05/14/12 was abnormal: inferior and apical defect consistent with prior inferior and apical infarct and mild peri-infarct ischemia, EF 52%. Therefore, cardiac catheterization was recommended. 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 Isosorbide 30 mg daily given for his myocardial bridging and evidence of diffuse distal diabetic disease on his cardiac catheterization.  The patient denies chest pain, shortness of breath, syncope, orthopnea, PND or significant pedal edema. Tolerating isosorbide well. A1C about 6.3 in June  Needs SL nitro  Labs June LDL 55 with TC 141    ROS: Denies fever, malais, weight loss, blurry vision, decreased visual acuity, cough, sputum, SOB, hemoptysis, pleuritic pain, palpitaitons, heartburn, abdominal pain, melena, lower extremity edema, claudication, or rash.  All other systems reviewed and negative  General: Affect appropriate Healthy:  appears stated age HEENT: normal Neck supple with no adenopathy JVP normal left bruits no thyromegaly Lungs clear with no wheezing and good diaphragmatic motion Heart:  S1/S2 no murmur, no rub, gallop or click PMI normal Abdomen: benighn, BS positve, no tenderness, no AAA no bruit.  No HSM or HJR Distal pulses intact with no bruits No edema Neuro non-focal Skin warm and dry No muscular weakness   Current Outpatient Prescriptions  Medication Sig Dispense Refill  . aspirin 81 MG tablet Take 81 mg by mouth daily.      .  clopidogrel (PLAVIX) 75 MG tablet Take 75 mg by mouth daily.      . hydrochlorothiazide (MICROZIDE) 12.5 MG capsule Take 12.5 mg by mouth daily.      . isosorbide mononitrate (IMDUR) 30 MG 24 hr tablet Take 1 tablet (30 mg total) by mouth daily.  30 tablet  11  . metFORMIN (GLUCOPHAGE) 1000 MG tablet Take 1,000 mg by mouth 2 (two) times daily with a meal.      . metoprolol (LOPRESSOR) 50 MG tablet Take 50 mg by mouth 2 (two) times daily.      . nitroGLYCERIN (NITROSTAT) 0.4 MG SL tablet Place 1 tablet (0.4 mg total) under the tongue every 5 (five) minutes as needed for chest pain.  25 tablet  4  . pravastatin (PRAVACHOL) 40 MG tablet Take 40 mg by mouth daily.       No current facility-administered medications for this visit.    Allergies  Review of patient's allergies indicates no known allergies.  Electrocardiogram:  SR rate 62 LAD IVCD  Assessment and Plan

## 2013-07-09 NOTE — Assessment & Plan Note (Signed)
F/U duplex Not previously noted  ASA

## 2013-07-09 NOTE — Assessment & Plan Note (Signed)
Cholesterol is at goal.  Continue current dose of statin and diet Rx.  No myalgias or side effects.  F/U  LFT's in 6 months. No results found for this basename: LDLCALC  Labs with primary            

## 2013-07-09 NOTE — Patient Instructions (Signed)
Your physician wants you to follow-up in:   YEAR  WITH  DR NISHAN You will receive a reminder letter in the mail two months in advance. If you don't receive a letter, please call our office to schedule the follow-up appointment. Your physician recommends that you continue on your current medications as directed. Please refer to the Current Medication list given to you today. Your physician has requested that you have a carotid duplex. This test is an ultrasound of the carotid arteries in your neck. It looks at blood flow through these arteries that supply the brain with blood. Allow one hour for this exam. There are no restrictions or special instructions.  

## 2013-07-10 ENCOUNTER — Other Ambulatory Visit: Payer: Self-pay | Admitting: *Deleted

## 2013-07-10 MED ORDER — ISOSORBIDE MONONITRATE ER 30 MG PO TB24: 30.0000 mg | ORAL_TABLET | Freq: Every day | ORAL | Status: DC

## 2013-07-10 NOTE — Telephone Encounter (Signed)
Fax Received. Refill Completed. Larry Hodges (R.M.A)   

## 2013-07-11 ENCOUNTER — Encounter (INDEPENDENT_AMBULATORY_CARE_PROVIDER_SITE_OTHER): Payer: Medicare Other

## 2013-07-11 DIAGNOSIS — R0989 Other specified symptoms and signs involving the circulatory and respiratory systems: Secondary | ICD-10-CM

## 2013-07-11 DIAGNOSIS — I6529 Occlusion and stenosis of unspecified carotid artery: Secondary | ICD-10-CM

## 2013-12-05 ENCOUNTER — Other Ambulatory Visit: Payer: Self-pay

## 2013-12-05 MED ORDER — ISOSORBIDE MONONITRATE ER 30 MG PO TB24: 30.0000 mg | ORAL_TABLET | Freq: Every day | ORAL | Status: DC

## 2014-05-19 ENCOUNTER — Ambulatory Visit (INDEPENDENT_AMBULATORY_CARE_PROVIDER_SITE_OTHER): Payer: Medicare PPO | Admitting: General Surgery

## 2014-05-19 ENCOUNTER — Encounter (INDEPENDENT_AMBULATORY_CARE_PROVIDER_SITE_OTHER): Payer: Self-pay | Admitting: General Surgery

## 2014-05-19 ENCOUNTER — Other Ambulatory Visit (INDEPENDENT_AMBULATORY_CARE_PROVIDER_SITE_OTHER): Payer: Self-pay | Admitting: General Surgery

## 2014-05-19 ENCOUNTER — Encounter (INDEPENDENT_AMBULATORY_CARE_PROVIDER_SITE_OTHER): Payer: Self-pay

## 2014-05-19 VITALS — BP 136/82 | HR 66 | Temp 97.4°F | Resp 18 | Ht 67.0 in | Wt 247.4 lb

## 2014-05-19 DIAGNOSIS — K429 Umbilical hernia without obstruction or gangrene: Secondary | ICD-10-CM

## 2014-05-19 NOTE — Progress Notes (Signed)
Patient ID: Larry Hodges, male   DOB: February 16, 1946, 68 y.o.   MRN: [id number]  No chief complaint on file.   HPI Larry Hodges is a 68 y.o. male.  The patient is a 68 year old male who is referred for evaluation of an umbilical hernia. The patient states that this is due to for several years. She's been having more pain over the last several months. This has since become more painful and symptomatic again today. He has no signs or symptoms of incarceration or translation.  Patient has had a previous MI thousand 5. He currently seeing Dr. Eden EmmsNishan for his Plavix and his cardiac followup.  HPI  Past Medical History  Diagnosis Date  . DM (diabetes mellitus)   . HTN (hypertension)   . Hyperlipidemia   . CAD (coronary artery disease)     [place] PCI;  Myoview performed 05/14/12 was abnormal: inferior and apical defect consistent with prior inferior and apical infarct and mild peri-infarct ischemia, EF 52%.  Therefore, cardiac catheterization was recommended.  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 -  med Rx  . Obesity     Past Surgical History  Procedure Laterality Date  . Shoulder surgery    . Hernia repair      Family History  Problem Relation Age of Onset  . Hypertension    . Kidney disease    . Hypertension Mother   . Kidney disease Father     Social History History  Substance Use Topics  . Smoking status: Former Smoker    Types: Cigarettes    Quit date: 12/01/2003  . Smokeless tobacco: Never Used  . Alcohol Use: Yes    No Known Allergies  Current Outpatient Prescriptions  Medication Sig Dispense Refill  . aspirin 81 MG tablet Take 81 mg by mouth daily.      . clopidogrel (PLAVIX) 75 MG tablet Take 75 mg by mouth daily.      . cyanocobalamin (,VITAMIN B-12,) 1000 MCG/ML injection       . hydrochlorothiazide (MICROZIDE) 12.5 MG capsule Take 12.5 mg  by mouth daily.      . isosorbide mononitrate (IMDUR) 30 MG 24 hr tablet Take 1 tablet (30 mg total) by mouth daily.  90 tablet  3  . lisinopril (PRINIVIL,ZESTRIL) 30 MG tablet       . metFORMIN (GLUCOPHAGE) 1000 MG tablet Take 1,000 mg by mouth 2 (two) times daily with a meal.      . metoprolol (LOPRESSOR) 50 MG tablet Take 50 mg by mouth 2 (two) times daily.      Marland Kitchen. NASONEX 50 MCG/ACT nasal spray       . nitroGLYCERIN (NITROSTAT) 0.4 MG SL tablet Place 1 tablet (0.4 mg total) under the tongue every 5 (five) minutes as needed for chest pain.  25 tablet  4  . pravastatin (PRAVACHOL) 40 MG tablet Take 40 mg by mouth daily.       No current facility-administered medications for this visit.    Review of Systems Review of Systems  Constitutional: Negative.   HENT: Negative.   Eyes: Negative.   Respiratory: Negative.   Cardiovascular: Negative.   Gastrointestinal: Negative.   Endocrine: Negative.   Neurological: Negative.     Blood pressure 136/82, pulse 66, temperature 97.4 F (36.3 C), temperature source Temporal, resp. rate 18, height 5\' 7"  (1.702 m), weight  247 lb 6.4 oz (112.22 kg).  Physical Exam Physical Exam  Constitutional: He is oriented to person, place, and time. He appears well-developed and well-nourished.  HENT:  Head: Normocephalic and atraumatic.  Eyes: Conjunctivae and EOM are normal. Pupils are equal, round, and reactive to light.  Neck: Normal range of motion. Neck supple.  Cardiovascular: Normal rate, regular rhythm and normal heart sounds.   Pulmonary/Chest: Effort normal and breath sounds normal.  Abdominal: Soft. Bowel sounds are normal.    Musculoskeletal: Normal range of motion.  Neurological: He is alert and oriented to person, place, and time.  Skin: Skin is warm and dry.    Data Reviewed CT scan reveals umbilical hernia about 3 cm in size.  Assessment    67-year-old male with an umbilical hernia     Plan    1. We will have the patient seen  by cardiology feel that his cardiac status as well as to clear him to be off of his Plavix for at least 5-7 days preoperatively. 2. The patient would like proceed to the operating room for a lap umbilical hernia repair with mesh. 3. All risks and benefits were discussed with the patient, to generally include infection, bleeding, damage to surrounding structures, acute and chronic nerve pain, and recurrence. Alternatives were offered and described.  All questions were answered and the patient voiced understanding of the procedure and wishes to proceed at this point.         Karmyn Lowman Jr., Derran Sear 05/19/2014, 11:10 AM    

## 2014-06-02 ENCOUNTER — Encounter (HOSPITAL_COMMUNITY): Payer: Self-pay | Admitting: Pharmacy Technician

## 2014-06-09 ENCOUNTER — Encounter (HOSPITAL_COMMUNITY)
Admission: RE | Admit: 2014-06-09 | Discharge: 2014-06-09 | Disposition: A | Discharge status: Home / Self Care | Payer: Medicare HMO | Source: Ambulatory Visit | Attending: General Surgery | Admitting: General Surgery

## 2014-06-09 ENCOUNTER — Encounter (HOSPITAL_COMMUNITY): Payer: Self-pay

## 2014-06-09 DIAGNOSIS — Z01812 Encounter for preprocedural laboratory examination: Secondary | ICD-10-CM | POA: Insufficient documentation

## 2014-06-09 DIAGNOSIS — Z01818 Encounter for other preprocedural examination: Secondary | ICD-10-CM | POA: Insufficient documentation

## 2014-06-09 HISTORY — DX: Sleep apnea, unspecified: G47.30

## 2014-06-09 HISTORY — DX: Unspecified osteoarthritis, unspecified site: M19.90

## 2014-06-09 HISTORY — DX: Acute myocardial infarction, unspecified: I21.9

## 2014-06-09 HISTORY — DX: Cerebral infarction, unspecified: I63.9

## 2014-06-09 LAB — CBC
HEMATOCRIT: 41.1 % (ref 39.0–52.0)
Hemoglobin: 13.8 g/dL (ref 13.0–17.0)
MCH: 30.7 pg (ref 26.0–34.0)
MCHC: 33.6 g/dL (ref 30.0–36.0)
MCV: 91.3 fL (ref 78.0–100.0)
PLATELETS: 245 10*3/uL (ref 150–400)
RBC: 4.5 MIL/uL (ref 4.22–5.81)
RDW: 14.1 % (ref 11.5–15.5)
WBC: 8.9 10*3/uL (ref 4.0–10.5)

## 2014-06-09 LAB — BASIC METABOLIC PANEL
ANION GAP: 13 (ref 5–15)
BUN: 16 mg/dL (ref 6–23)
CALCIUM: 9.8 mg/dL (ref 8.4–10.5)
CO2: 28 meq/L (ref 19–32)
Chloride: 102 mEq/L (ref 96–112)
Creatinine, Ser: 1.03 mg/dL (ref 0.50–1.35)
GFR calc Af Amer: 85 mL/min — ABNORMAL LOW (ref >90)
GFR, EST NON AFRICAN AMERICAN: 74 mL/min — AB (ref >90)
GLUCOSE: 117 mg/dL — AB (ref 70–99)
POTASSIUM: 4.2 meq/L (ref 3.7–5.3)
Sodium: 143 mEq/L (ref 137–147)

## 2014-06-09 NOTE — Progress Notes (Signed)
PCP is Mauricio Poegina York, MD Cardiologist is Courtney HeysPhilip Nishan, MD Pt states he has been off Plavix since Sun, Last dose was Sat. [facility] for [facility] CXR.

## 2014-06-09 NOTE — Pre-Procedure Instructions (Signed)
Ayan Cormany  06/09/2014   Your procedure is scheduled on:  July 15 th at 0830  Report to [facility] Admitting at 0630 AM.   Call this number if you have problems the morning of surgery: [phone]   Remember:   Do not eat food or drink liquids after midnight.   Take these medicines the morning of surgery with A SIP OF WATER: Imdur (Isosorbide mononitrate), Metoprolol (Lopressor), Nasonex if needed  Stop taking Aleve, Ibuprofen, BC's, Goody's, Herbal medications, Fish Oil, Aspirin Stop taking Plavix as directed by your cardiologist.   Do not wear jewelry, make-up or nail polish.  Do not wear lotions, powders, or perfumes. You may wear deodorant.  Do not shave 48 hours prior to surgery. Men may shave face and neck.  Do not bring valuables to the hospital.  [facility] is not responsible for any belongings or valuables.               Contacts, dentures or bridgework may not be worn into surgery.  Leave suitcase in the car. After surgery it may be brought to your room.  For patients admitted to the hospital, discharge time is determined by your treatment team.               Patients discharged the day of surgery will not be allowed to drive home.    Special Instructions:[facility] - Preparing for Surgery  Before surgery, you can play an important role.  Because skin is not sterile, your skin needs to be as free of germs as possible.  You can reduce the number of germs on you skin by washing with CHG (chlorahexidine gluconate) soap before surgery.  CHG is an antiseptic cleaner which kills germs and bonds with the skin to continue killing germs even after washing.  Please DO NOT use if you have an allergy to CHG or antibacterial soaps.  If your skin becomes reddened/irritated stop using the CHG and inform your nurse when you arrive at Short Stay.  Do not shave (including legs and underarms) for at least 48 hours prior to the first CHG shower.  You may shave your  face.  Please follow these instructions carefully:   1.  Shower with CHG Soap the night before surgery and the                                morning of Surgery.  2.  If you choose to wash your hair, wash your hair first as usual with your       normal shampoo.  3.  After you shampoo, rinse your hair and body thoroughly to remove the                      Shampoo.  4.  Use CHG as you would any other liquid soap.  You can apply chg directly       to the skin and wash gently with scrungie or a clean washcloth.  5.  Apply the CHG Soap to your body ONLY FROM THE NECK DOWN.        Do not use on open wounds or open sores.  Avoid contact with your eyes,       ears, mouth and genitals (private parts).  Wash genitals (private parts)       with your normal soap.  6.  Wash thoroughly, paying special attention to the  area where your surgery        will be performed.  7.  Thoroughly rinse your body with warm water from the neck down.  8.  DO NOT shower/wash with your normal soap after using and rinsing off       the CHG Soap.  9.  Pat yourself dry with a clean towel.            10.  Wear clean pajamas.            11.  Place clean sheets on your bed the night of your first shower and do not        sleep with pets.  Day of Surgery  Do not apply any lotions/deoderants the morning of surgery.  Please wear clean clothes to the hospital/surgery center.      Please read over the following fact sheets that you were given: Pain Booklet, Coughing and Deep Breathing and Surgical Site Infection Prevention

## 2014-06-10 NOTE — Progress Notes (Addendum)
Anesthesia chart review:  Patient is a 68 year old male scheduled for laparoscopic umbilical hernia on 06/11/14 by Dr. Derrell Lollingamirez.  History includes former smoker, CAD/MI '05 (no PCI), DM2, HTN, HLD, TIA, OSA, arthritis. BMI is consistent with obesity. PCP is Mauricio Poegina York, NP.  Cardiologist is Dr. Eden EmmsNishan who felt patient was okay to hold Plavix for five days and would be okay for surgery.  EKG on 07/09/13 showed NSR, LAD, non-specific intraventricular block.  His last stress test on 05/15/12 (partially reversible inferior and apical defect consistent with prior inferior and apical infarct and mild peri-infarct ischemia in the inferior wall) lead to a cardiac cath on 05/25/12 that showed:  Coronary Arteries:  Right dominant with no anomalies  LM: 30% mid lesion  LAD: 30-40% ostial. Long mid/distal segment of ? Myocardial bridging. 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 Medical Rx Better control of DM  And add nitrates.  CXR on 01/03/14 [facility]) showed: Minimal bronchitic changes without infiltrate.  Preoperative labs noted.    If no acute changes then plan to proceed. Of note, since this morning it appears case has been rescheduled for 06/13/14.  Velna Ochsllison Kayde Warehime, PA-C [facility] Short Stay [facility]/Anesthesiology Phone [phone] 06/10/2014 4:40 PM

## 2014-06-12 MED ORDER — CEFAZOLIN SODIUM-DEXTROSE 2-3 GM-% IV SOLR
2.0000 g | INTRAVENOUS | Status: AC
  Administered 2014-06-13: 2 g via INTRAVENOUS
  Filled 2014-06-12: qty 50

## 2014-06-12 NOTE — Progress Notes (Signed)
Patient is aware of new arrival time of 0530.  DA

## 2014-06-13 ENCOUNTER — Encounter (HOSPITAL_COMMUNITY): Payer: Self-pay | Admitting: *Deleted

## 2014-06-13 ENCOUNTER — Ambulatory Visit (HOSPITAL_COMMUNITY)
Admission: RE | Admit: 2014-06-13 | Discharge: 2014-06-13 | Disposition: A | Discharge status: Home / Self Care | Payer: Medicare HMO | Source: Ambulatory Visit | Attending: General Surgery | Admitting: General Surgery

## 2014-06-13 ENCOUNTER — Encounter (HOSPITAL_COMMUNITY)
Admission: RE | Disposition: A | Discharge status: Home / Self Care | Payer: Self-pay | Source: Ambulatory Visit | Attending: General Surgery

## 2014-06-13 ENCOUNTER — Ambulatory Visit (HOSPITAL_COMMUNITY): Payer: Medicare HMO | Admitting: Anesthesiology

## 2014-06-13 ENCOUNTER — Encounter (HOSPITAL_COMMUNITY): Payer: Medicare HMO | Admitting: Vascular Surgery

## 2014-06-13 DIAGNOSIS — Z7902 Long term (current) use of antithrombotics/antiplatelets: Secondary | ICD-10-CM | POA: Insufficient documentation

## 2014-06-13 DIAGNOSIS — E119 Type 2 diabetes mellitus without complications: Secondary | ICD-10-CM | POA: Insufficient documentation

## 2014-06-13 DIAGNOSIS — Z79899 Other long term (current) drug therapy: Secondary | ICD-10-CM | POA: Diagnosis not present

## 2014-06-13 DIAGNOSIS — K429 Umbilical hernia without obstruction or gangrene: Secondary | ICD-10-CM | POA: Insufficient documentation

## 2014-06-13 DIAGNOSIS — I2510 Atherosclerotic heart disease of native coronary artery without angina pectoris: Secondary | ICD-10-CM | POA: Diagnosis not present

## 2014-06-13 DIAGNOSIS — G4730 Sleep apnea, unspecified: Secondary | ICD-10-CM | POA: Diagnosis not present

## 2014-06-13 DIAGNOSIS — Z6838 Body mass index (BMI) 38.0-38.9, adult: Secondary | ICD-10-CM | POA: Insufficient documentation

## 2014-06-13 DIAGNOSIS — I10 Essential (primary) hypertension: Secondary | ICD-10-CM | POA: Insufficient documentation

## 2014-06-13 DIAGNOSIS — I739 Peripheral vascular disease, unspecified: Secondary | ICD-10-CM | POA: Insufficient documentation

## 2014-06-13 DIAGNOSIS — Z7982 Long term (current) use of aspirin: Secondary | ICD-10-CM | POA: Diagnosis not present

## 2014-06-13 DIAGNOSIS — Z87891 Personal history of nicotine dependence: Secondary | ICD-10-CM | POA: Diagnosis not present

## 2014-06-13 DIAGNOSIS — I252 Old myocardial infarction: Secondary | ICD-10-CM | POA: Insufficient documentation

## 2014-06-13 DIAGNOSIS — E785 Hyperlipidemia, unspecified: Secondary | ICD-10-CM | POA: Insufficient documentation

## 2014-06-13 HISTORY — PX: INSERTION OF MESH: SHX5868

## 2014-06-13 HISTORY — PX: UMBILICAL HERNIA REPAIR: SHX196

## 2014-06-13 LAB — GLUCOSE, CAPILLARY
GLUCOSE-CAPILLARY: 162 mg/dL — AB (ref 70–99)
Glucose-Capillary: 124 mg/dL — ABNORMAL HIGH (ref 70–99)

## 2014-06-13 SURGERY — REPAIR, HERNIA, UMBILICAL, LAPAROSCOPIC
Anesthesia: General | Site: Abdomen

## 2014-06-13 MED ORDER — METOCLOPRAMIDE HCL 5 MG/ML IJ SOLN
INTRAMUSCULAR | Status: AC
  Filled 2014-06-13: qty 2

## 2014-06-13 MED ORDER — MIDAZOLAM HCL 5 MG/5ML IJ SOLN
INTRAMUSCULAR | Status: DC | PRN
  Administered 2014-06-13: 2 mg via INTRAVENOUS

## 2014-06-13 MED ORDER — ONDANSETRON HCL 4 MG/2ML IJ SOLN
INTRAMUSCULAR | Status: DC | PRN
  Administered 2014-06-13: 4 mg via INTRAVENOUS

## 2014-06-13 MED ORDER — LACTATED RINGERS IV SOLN
INTRAVENOUS | Status: DC | PRN
  Administered 2014-06-13 (×2): via INTRAVENOUS

## 2014-06-13 MED ORDER — ROCURONIUM BROMIDE 50 MG/5ML IV SOLN
INTRAVENOUS | Status: AC
  Filled 2014-06-13: qty 1

## 2014-06-13 MED ORDER — FENTANYL CITRATE 0.05 MG/ML IJ SOLN
INTRAMUSCULAR | Status: DC | PRN
Start: 2014-06-13 — End: 2014-06-13
  Administered 2014-06-13 (×4): 50 ug via INTRAVENOUS

## 2014-06-13 MED ORDER — BUPIVACAINE HCL (PF) 0.25 % IJ SOLN
INTRAMUSCULAR | Status: AC
  Filled 2014-06-13: qty 30

## 2014-06-13 MED ORDER — FENTANYL CITRATE 0.05 MG/ML IJ SOLN
INTRAMUSCULAR | Status: AC
  Filled 2014-06-13: qty 5

## 2014-06-13 MED ORDER — SUCCINYLCHOLINE CHLORIDE 20 MG/ML IJ SOLN
INTRAMUSCULAR | Status: DC | PRN
  Administered 2014-06-13: 120 mg via INTRAVENOUS

## 2014-06-13 MED ORDER — ROCURONIUM BROMIDE 100 MG/10ML IV SOLN
INTRAVENOUS | Status: DC | PRN
  Administered 2014-06-13: 25 mg via INTRAVENOUS

## 2014-06-13 MED ORDER — OXYCODONE HCL 5 MG PO TABS
ORAL_TABLET | ORAL | Status: AC
  Filled 2014-06-13: qty 1

## 2014-06-13 MED ORDER — HYDROMORPHONE HCL PF 1 MG/ML IJ SOLN
0.2500 mg | INTRAMUSCULAR | Status: DC | PRN
  Administered 2014-06-13 (×4): 0.5 mg via INTRAVENOUS

## 2014-06-13 MED ORDER — HYDROMORPHONE HCL PF 1 MG/ML IJ SOLN
INTRAMUSCULAR | Status: AC
  Filled 2014-06-13: qty 1

## 2014-06-13 MED ORDER — 0.9 % SODIUM CHLORIDE (POUR BTL) OPTIME
TOPICAL | Status: DC | PRN
  Administered 2014-06-13: 1000 mL

## 2014-06-13 MED ORDER — ONDANSETRON HCL 4 MG/2ML IJ SOLN
INTRAMUSCULAR | Status: AC
  Filled 2014-06-13: qty 2

## 2014-06-13 MED ORDER — MIDAZOLAM HCL 2 MG/2ML IJ SOLN
INTRAMUSCULAR | Status: AC
  Filled 2014-06-13: qty 2

## 2014-06-13 MED ORDER — METOCLOPRAMIDE HCL 5 MG/ML IJ SOLN
10.0000 mg | Freq: Once | INTRAMUSCULAR | Status: AC | PRN
  Administered 2014-06-13: 10 mg via INTRAVENOUS

## 2014-06-13 MED ORDER — PHENYLEPHRINE 40 MCG/ML (10ML) SYRINGE FOR IV PUSH (FOR BLOOD PRESSURE SUPPORT)
PREFILLED_SYRINGE | INTRAVENOUS | Status: AC
  Filled 2014-06-13: qty 10

## 2014-06-13 MED ORDER — OXYCODONE-ACETAMINOPHEN 5-325 MG PO TABS: 1.0000 | ORAL_TABLET | ORAL | Status: DC | PRN

## 2014-06-13 MED ORDER — PHENYLEPHRINE HCL 10 MG/ML IJ SOLN
INTRAMUSCULAR | Status: DC | PRN
  Administered 2014-06-13 (×2): 80 ug via INTRAVENOUS

## 2014-06-13 MED ORDER — METOCLOPRAMIDE HCL 5 MG/ML IJ SOLN
INTRAMUSCULAR | Status: DC | PRN
  Administered 2014-06-13: 10 mg via INTRAVENOUS

## 2014-06-13 MED ORDER — CHLORHEXIDINE GLUCONATE 4 % EX LIQD
1.0000 "application " | Freq: Once | CUTANEOUS | Status: DC
  Filled 2014-06-13: qty 15

## 2014-06-13 MED ORDER — GLYCOPYRROLATE 0.2 MG/ML IJ SOLN
INTRAMUSCULAR | Status: AC
  Filled 2014-06-13: qty 2

## 2014-06-13 MED ORDER — GLYCOPYRROLATE 0.2 MG/ML IJ SOLN
INTRAMUSCULAR | Status: DC | PRN
  Administered 2014-06-13: .7 mg via INTRAVENOUS

## 2014-06-13 MED ORDER — LIDOCAINE HCL (CARDIAC) 20 MG/ML IV SOLN
INTRAVENOUS | Status: AC
  Filled 2014-06-13: qty 5

## 2014-06-13 MED ORDER — PROPOFOL 10 MG/ML IV BOLUS
INTRAVENOUS | Status: AC
  Filled 2014-06-13: qty 20

## 2014-06-13 MED ORDER — DEXAMETHASONE SODIUM PHOSPHATE 4 MG/ML IJ SOLN
INTRAMUSCULAR | Status: DC | PRN
  Administered 2014-06-13: 4 mg via INTRAVENOUS

## 2014-06-13 MED ORDER — NEOSTIGMINE METHYLSULFATE 10 MG/10ML IV SOLN
INTRAVENOUS | Status: DC | PRN
  Administered 2014-06-13: 4 mg via INTRAVENOUS

## 2014-06-13 MED ORDER — LIDOCAINE HCL (CARDIAC) 20 MG/ML IV SOLN
INTRAVENOUS | Status: DC | PRN
  Administered 2014-06-13: 100 mg via INTRAVENOUS

## 2014-06-13 MED ORDER — SUCCINYLCHOLINE CHLORIDE 20 MG/ML IJ SOLN
INTRAMUSCULAR | Status: AC
  Filled 2014-06-13: qty 1

## 2014-06-13 MED ORDER — DEXAMETHASONE SODIUM PHOSPHATE 4 MG/ML IJ SOLN
INTRAMUSCULAR | Status: AC
  Filled 2014-06-13: qty 1

## 2014-06-13 MED ORDER — OXYCODONE HCL 5 MG/5ML PO SOLN: 5.0000 mg | Freq: Once | ORAL | Status: AC | PRN

## 2014-06-13 MED ORDER — HYDROMORPHONE HCL PF 1 MG/ML IJ SOLN
INTRAMUSCULAR | Status: AC
  Administered 2014-06-13: 0.5 mg via INTRAVENOUS
  Filled 2014-06-13: qty 1

## 2014-06-13 MED ORDER — NEOSTIGMINE METHYLSULFATE 10 MG/10ML IV SOLN
INTRAVENOUS | Status: AC
  Filled 2014-06-13: qty 1

## 2014-06-13 MED ORDER — PROPOFOL 10 MG/ML IV BOLUS
INTRAVENOUS | Status: DC | PRN
  Administered 2014-06-13: 200 mg via INTRAVENOUS

## 2014-06-13 MED ORDER — OXYCODONE HCL 5 MG PO TABS
5.0000 mg | ORAL_TABLET | Freq: Once | ORAL | Status: AC | PRN
  Administered 2014-06-13: 5 mg via ORAL

## 2014-06-13 SURGICAL SUPPLY — 55 items
APPLIER CLIP LOGIC TI 5 (MISCELLANEOUS) IMPLANT
APPLIER CLIP ROT 10 11.4 M/L (STAPLE)
BENZOIN TINCTURE PRP APPL 2/3 (GAUZE/BANDAGES/DRESSINGS) ×3 IMPLANT
BLADE SURG ROTATE 9660 (MISCELLANEOUS) ×3 IMPLANT
CANISTER SUCTION 2500CC (MISCELLANEOUS) IMPLANT
CHLORAPREP W/TINT 26ML (MISCELLANEOUS) ×3 IMPLANT
CLIP APPLIE ROT 10 11.4 M/L (STAPLE) IMPLANT
COVER SURGICAL LIGHT HANDLE (MISCELLANEOUS) ×3 IMPLANT
DEVICE SECURE STRAP 25 ABSORB (INSTRUMENTS) ×6 IMPLANT
DEVICE TROCAR PUNCTURE CLOSURE (ENDOMECHANICALS) ×3 IMPLANT
DRAPE UTILITY 15X26 W/TAPE STR (DRAPE) ×6 IMPLANT
ELECT REM PT RETURN 9FT ADLT (ELECTROSURGICAL) ×3
ELECTRODE REM PT RTRN 9FT ADLT (ELECTROSURGICAL) ×1 IMPLANT
GAUZE SPONGE 2X2 8PLY STRL LF (GAUZE/BANDAGES/DRESSINGS) ×1 IMPLANT
GLOVE BIO SURGEON STRL SZ 6.5 (GLOVE) ×4 IMPLANT
GLOVE BIO SURGEON STRL SZ7.5 (GLOVE) ×6 IMPLANT
GLOVE BIO SURGEONS STRL SZ 6.5 (GLOVE) ×2
GLOVE BIOGEL PI IND STRL 6.5 (GLOVE) ×1 IMPLANT
GLOVE BIOGEL PI IND STRL 7.0 (GLOVE) ×2 IMPLANT
GLOVE BIOGEL PI IND STRL 7.5 (GLOVE) ×1 IMPLANT
GLOVE BIOGEL PI INDICATOR 6.5 (GLOVE) ×2
GLOVE BIOGEL PI INDICATOR 7.0 (GLOVE) ×4
GLOVE BIOGEL PI INDICATOR 7.5 (GLOVE) ×2
GLOVE SS BIOGEL STRL SZ 6.5 (GLOVE) ×1 IMPLANT
GLOVE SUPERSENSE BIOGEL SZ 6.5 (GLOVE) ×2
GOWN STRL REUS W/ TWL LRG LVL3 (GOWN DISPOSABLE) ×3 IMPLANT
GOWN STRL REUS W/ TWL XL LVL3 (GOWN DISPOSABLE) ×1 IMPLANT
GOWN STRL REUS W/TWL LRG LVL3 (GOWN DISPOSABLE) ×6
GOWN STRL REUS W/TWL XL LVL3 (GOWN DISPOSABLE) ×2
KIT BASIN OR (CUSTOM PROCEDURE TRAY) ×3 IMPLANT
KIT ROOM TURNOVER OR (KITS) ×3 IMPLANT
MARKER SKIN DUAL TIP RULER LAB (MISCELLANEOUS) ×3 IMPLANT
MESH VENTRALIGHT ST 8X10 (Mesh General) ×3 IMPLANT
NEEDLE INSUFFLATION 14GA 120MM (NEEDLE) ×3 IMPLANT
NEEDLE SPNL 22GX3.5 QUINCKE BK (NEEDLE) IMPLANT
NS IRRIG 1000ML POUR BTL (IV SOLUTION) ×3 IMPLANT
PAD ARMBOARD 7.5X6 YLW CONV (MISCELLANEOUS) ×6 IMPLANT
SCISSORS LAP 5X35 DISP (ENDOMECHANICALS) ×3 IMPLANT
SET IRRIG TUBING LAPAROSCOPIC (IRRIGATION / IRRIGATOR) IMPLANT
SLEEVE ENDOPATH XCEL 5M (ENDOMECHANICALS) ×6 IMPLANT
SPONGE GAUZE 2X2 STER 10/PKG (GAUZE/BANDAGES/DRESSINGS) ×2
SPONGE GAUZE 4X4 12PLY (GAUZE/BANDAGES/DRESSINGS) ×3 IMPLANT
SUT CHROMIC 2 0 SH (SUTURE) ×3 IMPLANT
SUT MNCRL AB 4-0 PS2 18 (SUTURE) ×6 IMPLANT
SUT PROLENE 2 0 KS (SUTURE) ×6 IMPLANT
TAPE CLOTH SOFT 2X10 (GAUZE/BANDAGES/DRESSINGS) ×3 IMPLANT
TOWEL OR 17X24 6PK STRL BLUE (TOWEL DISPOSABLE) IMPLANT
TOWEL OR 17X26 10 PK STRL BLUE (TOWEL DISPOSABLE) ×3 IMPLANT
TOWEL OR NON WOVEN STRL DISP B (DISPOSABLE) ×3 IMPLANT
TRAY FOLEY CATH 14FR (SET/KITS/TRAYS/PACK) IMPLANT
TRAY FOLEY CATH 16FR SILVER (SET/KITS/TRAYS/PACK) ×3 IMPLANT
TRAY LAPAROSCOPIC (CUSTOM PROCEDURE TRAY) ×3 IMPLANT
TROCAR XCEL BLUNT TIP 100MML (ENDOMECHANICALS) IMPLANT
TROCAR XCEL NON-BLD 11X100MML (ENDOMECHANICALS) IMPLANT
TROCAR XCEL NON-BLD 5MMX100MML (ENDOMECHANICALS) ×3 IMPLANT

## 2014-06-13 NOTE — Discharge Instructions (Signed)
[facility] _______[facility] ° °UMBILICAL HERNIA REPAIR: POST OP INSTRUCTIONS ° °Always review your discharge instruction sheet given to you by the facility where your surgery was performed. °IF YOU HAVE DISABILITY OR FAMILY LEAVE FORMS, YOU MUST BRING THEM TO THE OFFICE FOR PROCESSING.   °DO NOT GIVE THEM TO YOUR DOCTOR. ° °1. A  prescription for pain medication may be given to you upon discharge.  Take your pain medication as prescribed, if needed.  If narcotic pain medicine is not needed, then you may take acetaminophen (Tylenol) or ibuprofen (Advil) as needed. °2. Take your usually prescribed medications unless otherwise directed. °3. If you need a refill on your pain medication, please contact your pharmacy.  They will contact our office to request authorization. Prescriptions will not be filled after 5 pm or on week-ends. °4. You should follow a light diet the first 24 hours after arrival home, such as soup and crackers, etc.  Be sure to include lots of fluids daily.  Resume your normal diet the day after surgery. °5. Most patients will experience some swelling and bruising around the umbilicus or in the groin and scrotum.  Ice packs and reclining will help.  Swelling and bruising can take several days to resolve.  °6. It is common to experience some constipation if taking pain medication after surgery.  Increasing fluid intake and taking a stool softener (such as Colace) will usually help or prevent this problem from occurring.  A mild laxative (Milk of Magnesia or Miralax) should be taken according to package directions if there are no bowel movements after 48 hours. °7. Unless discharge instructions indicate otherwise, you may remove your bandages 24-48 hours after surgery, and you may shower at that time.  You may have steri-strips (small skin tapes) in place directly over the incision.  These strips should be left on the skin for 7-10 days.  If your surgeon used skin glue on the incision, you  may shower in 24 hours.  The glue will flake off over the next 2-3 weeks.  Any sutures or staples will be removed at the office during your follow-up visit. °8. ACTIVITIES:  You may resume regular (light) daily activities beginning the next day--such as daily self-care, walking, climbing stairs--gradually increasing activities as tolerated.  You may have sexual intercourse when it is comfortable.  Refrain from any heavy lifting or straining until approved by your doctor. °a. You may drive when you are no longer taking prescription pain medication, you can comfortably wear a seatbelt, and you can safely maneuver your car and apply brakes. °b. RETURN TO WORK:  __________________________________________________________ °9. You should see your doctor in the office for a follow-up appointment approximately 2-3 weeks after your surgery.  Make sure that you call for this appointment within a day or two after you arrive home to insure a convenient appointment time. °10. OTHER INSTRUCTIONS:  __________________________________________________________________________________________________________________________________________________________________________________________  °WHEN TO CALL YOUR DOCTOR: °1. Fever over 101.0 °2. Inability to urinate °3. Nausea and/or vomiting °4. Extreme swelling or bruising °5. Continued bleeding from incision. °6. Increased pain, redness, or drainage from the incision ° °The clinic staff is available to answer your questions during regular business hours.  Please don’t hesitate to call and ask to speak to one of the nurses for clinical concerns.  If you have a medical emergency, go to the nearest emergency room or call 911.  A surgeon from [facility] is always on call at the hospital ° ° °[address], [address], [place], [place]  [address] ? ° [address] [address], [place], [place]   [address] °[phone] ? [phone] ? FAX [phone] °Web site:  [email or web address] ° °

## 2014-06-13 NOTE — H&P (View-Only) (Signed)
Patient ID: Larry Hodges, male   DOB: February 16, 1946, 68 y.o.   MRN: [id number]  No chief complaint on file.   HPI Larry Hodges is a 68 y.o. male.  The patient is a 68 year old male who is referred for evaluation of an umbilical hernia. The patient states that this is due to for several years. She's been having more pain over the last several months. This has since become more painful and symptomatic again today. He has no signs or symptoms of incarceration or translation.  Patient has had a previous MI thousand 5. He currently seeing Dr. Eden EmmsNishan for his Plavix and his cardiac followup.  HPI  Past Medical History  Diagnosis Date  . DM (diabetes mellitus)   . HTN (hypertension)   . Hyperlipidemia   . CAD (coronary artery disease)     remote MI 2005 in [place] - no PCI;  Myoview performed 05/14/12 was abnormal: inferior and apical defect consistent with prior inferior and apical infarct and mild peri-infarct ischemia, EF 52%.  Therefore, cardiac catheterization was recommended.  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 -  med Rx  . Obesity     Past Surgical History  Procedure Laterality Date  . Shoulder surgery    . Hernia repair      Family History  Problem Relation Age of Onset  . Hypertension    . Kidney disease    . Hypertension Mother   . Kidney disease Father     Social History History  Substance Use Topics  . Smoking status: Former Smoker    Types: Cigarettes    Quit date: 12/01/2003  . Smokeless tobacco: Never Used  . Alcohol Use: Yes    No Known Allergies  Current Outpatient Prescriptions  Medication Sig Dispense Refill  . aspirin 81 MG tablet Take 81 mg by mouth daily.      . clopidogrel (PLAVIX) 75 MG tablet Take 75 mg by mouth daily.      . cyanocobalamin (,VITAMIN B-12,) 1000 MCG/ML injection       . hydrochlorothiazide (MICROZIDE) 12.5 MG capsule Take 12.5 mg  by mouth daily.      . isosorbide mononitrate (IMDUR) 30 MG 24 hr tablet Take 1 tablet (30 mg total) by mouth daily.  90 tablet  3  . lisinopril (PRINIVIL,ZESTRIL) 30 MG tablet       . metFORMIN (GLUCOPHAGE) 1000 MG tablet Take 1,000 mg by mouth 2 (two) times daily with a meal.      . metoprolol (LOPRESSOR) 50 MG tablet Take 50 mg by mouth 2 (two) times daily.      Marland Kitchen. NASONEX 50 MCG/ACT nasal spray       . nitroGLYCERIN (NITROSTAT) 0.4 MG SL tablet Place 1 tablet (0.4 mg total) under the tongue every 5 (five) minutes as needed for chest pain.  25 tablet  4  . pravastatin (PRAVACHOL) 40 MG tablet Take 40 mg by mouth daily.       No current facility-administered medications for this visit.    Review of Systems Review of Systems  Constitutional: Negative.   HENT: Negative.   Eyes: Negative.   Respiratory: Negative.   Cardiovascular: Negative.   Gastrointestinal: Negative.   Endocrine: Negative.   Neurological: Negative.     Blood pressure 136/82, pulse 66, temperature 97.4 F (36.3 C), temperature source Temporal, resp. rate 18, height 5\' 7"  (1.702 m), weight  247 lb 6.4 oz (112.22 kg).  Physical Exam Physical Exam  Constitutional: He is oriented to person, place, and time. He appears well-developed and well-nourished.  HENT:  Head: Normocephalic and atraumatic.  Eyes: Conjunctivae and EOM are normal. Pupils are equal, round, and reactive to light.  Neck: Normal range of motion. Neck supple.  Cardiovascular: Normal rate, regular rhythm and normal heart sounds.   Pulmonary/Chest: Effort normal and breath sounds normal.  Abdominal: Soft. Bowel sounds are normal.    Musculoskeletal: Normal range of motion.  Neurological: He is alert and oriented to person, place, and time.  Skin: Skin is warm and dry.    Data Reviewed CT scan reveals umbilical hernia about 3 cm in size.  Assessment    68 year old male with an umbilical hernia     Plan    1. We will have the patient seen  by cardiology feel that his cardiac status as well as to clear him to be off of his Plavix for at least 5-7 days preoperatively. 2. The patient would like proceed to the operating room for a lap umbilical hernia repair with mesh. 3. All risks and benefits were discussed with the patient, to generally include infection, bleeding, damage to surrounding structures, acute and chronic nerve pain, and recurrence. Alternatives were offered and described.  All questions were answered and the patient voiced understanding of the procedure and wishes to proceed at this point.         Marigene Ehlersamirez Jr., Larry 05/19/2014, 11:10 AM

## 2014-06-13 NOTE — Anesthesia Postprocedure Evaluation (Signed)
Anesthesia Post Note  Patient: Larry Hodges  Procedure(s) Performed: Procedure(s) (LRB): LAPAROSCOPIC UMBILICAL HERNIA (N/A) INSERTION OF MESH (N/A)  Anesthesia type: General  Patient location: PACU  Post pain: Pain level controlled  Post assessment: Patient's Cardiovascular Status Stable  Last Vitals:  Filed Vitals:   06/13/14 1016  BP: 136/65  Pulse: 59  Temp:   Resp: 14    Post vital signs: Reviewed and stable  Level of consciousness: alert  Complications: No apparent anesthesia complications

## 2014-06-13 NOTE — Anesthesia Preprocedure Evaluation (Addendum)
Anesthesia Evaluation  Patient identified by MRN, date of birth, ID band Patient awake    Reviewed: Allergy & Precautions, H&P , NPO status , Patient's Chart, lab work & pertinent test results, reviewed documented beta blocker date and time   History of Anesthesia Complications Negative for: history of anesthetic complications  Airway Mallampati: II TM Distance: >3 FB Neck ROM: full    Dental  (+) Edentulous Upper, Dental Advisory Given   Pulmonary sleep apnea and Continuous Positive Airway Pressure Ventilation , former smoker,  breath sounds clear to auscultation        Cardiovascular hypertension, On Medications and On Home Beta Blockers + CAD, + Past MI and + Peripheral Vascular Disease Rhythm:regular     Neuro/Psych CVA, No Residual Symptoms negative psych ROS   GI/Hepatic negative GI ROS, Neg liver ROS,   Endo/Other  diabetes, Well Controlled, Type 2, Oral Hypoglycemic AgentsMorbid obesity  Renal/GU negative Renal ROS  negative genitourinary   Musculoskeletal   Abdominal   Peds  Hematology negative hematology ROS (+)   Anesthesia Other Findings See surgeon's H&P   Reproductive/Obstetrics negative OB ROS                         Anesthesia Physical Anesthesia Plan  ASA: III  Anesthesia Plan: General   Post-op Pain Management:    Induction: Intravenous  Airway Management Planned: Oral ETT  Additional Equipment:   Intra-op Plan:   Post-operative Plan: Extubation in OR  Informed Consent: I have reviewed the patients History and Physical, chart, labs and discussed the procedure including the risks, benefits and alternatives for the proposed anesthesia with the patient or authorized representative who has indicated his/her understanding and acceptance.   Dental Advisory Given  Plan Discussed with: CRNA and Surgeon  Anesthesia Plan Comments:         Anesthesia Quick  Evaluation

## 2014-06-13 NOTE — Interval H&P Note (Signed)
History and Physical Interval Note:  06/13/2014 7:05 AM  Larry Hodges  has presented today for surgery, with the diagnosis of Hernia  The various methods of treatment have been discussed with the patient and family. After consideration of risks, benefits and other options for treatment, the patient has consented to  Procedure(s): LAPAROSCOPIC UMBILICAL HERNIA (N/A) INSERTION OF MESH (N/A) as a surgical intervention .  The patient's history has been reviewed, patient examined, no change in status, stable for surgery.  I have reviewed the patient's chart and labs.  Questions were answered to the patient's satisfaction.     Marigene Ehlersamirez Jr., Jed LimerickArmando

## 2014-06-13 NOTE — Op Note (Signed)
06/13/2014  8:32 AM  PATIENT:  Larry Hodges  68 y.o. male  PRE-OPERATIVE DIAGNOSIS:  umbilical hernia  POST-OPERATIVE DIAGNOSIS:  umbilical hernia  PROCEDURE:  Procedure(s): LAPAROSCOPIC UMBILICAL HERNIA (N/A) INSERTION OF MESH (N/A)  SURGEON:  Surgeon(s) and Role:    * Axel FillerArmando Adarius Tigges, MD - Primary  ASSISTANTS: Amie CritchleyMary Stuckey-PA student   ANESTHESIA:   local and general  EBL:  Total I/O In: 1000 [I.V.:1000] Out: 150 [Urine:120; Blood:30]  BLOOD ADMINISTERED:none  DRAINS: none   LOCAL MEDICATIONS USED:  BUPIVICAINE   SPECIMEN:  No Specimen  DISPOSITION OF SPECIMEN:  N/A  COUNTS:  YES  TOURNIQUET:  * No tourniquets in log *  DICTATION: .Dragon Dictation Details of the procedure:   After the patient was consented patient was taken back to the operating room patient was then placed in supine position bilateral SCDs in place. After antibiotics were confirmed a timeout was called and all facts were verified. The Veress needle technique was used to insuflate the abdomen at Palmer's point. The abdomen was insufflated to 14 mm mercury. Subsequently a 5 mm trocar was placed a camera inserted there was no injury to any intra-abdominal organs.  There was seen to be a 2cm umbilical hernia.  A second camera port was in placed into the left lower quadrant.   At this the Falicform ligament was taken down  with Bovie cautery maintaining hemostasis. A 5mm port was placed in the epigastrium . I proceeded to reduce the hernia contents.  Once the hernia was cleared away, a Bard Ventralight 15cm  mesh was inserted into the abdomen.  The mesh was secured circumferentially with am Securestrap tacker in a double crown fashion.  2-0 Prolenes were placed at 12:00, 3:00, 5:00 and 9:00 transfacially with and Endoclose device.   The falciform ligament was then brought up to the abdominal wall and secured with the Securestrap. The omentum was brought over the area of the mesh. The pneumoperitoneum was  evacuated  & all trocars  were removed. The skin was reapproximated with 4-0  Monocryl sutures in a subcuticular fashion. The skin was dressed with Steri-Strips tape and gauze.  The patient was taken to the recovery room in stable condition.    PLAN OF CARE: Discharge to home after PACU  PATIENT DISPOSITION:  PACU - hemodynamically stable.   Delay start of Pharmacological VTE agent (>24hrs) due to surgical blood loss or risk of bleeding: not applicable

## 2014-06-13 NOTE — Progress Notes (Signed)
Pt able to void a small amt.

## 2014-06-13 NOTE — Transfer of Care (Signed)
Immediate Anesthesia Transfer of Care Note  Patient: Larry Hodges  Procedure(s) Performed: Procedure(s): LAPAROSCOPIC UMBILICAL HERNIA (N/A) INSERTION OF MESH (N/A)  Patient Location: PACU  Anesthesia Type:General  Level of Consciousness: awake, alert , oriented and patient cooperative  Airway & Oxygen Therapy: Patient Spontanous Breathing and Patient connected to face mask oxygen  Post-op Assessment: Report given to PACU RN and Post -op Vital signs reviewed and stable  Post vital signs: Reviewed and stable  Complications: No apparent anesthesia complications

## 2014-06-17 ENCOUNTER — Encounter (HOSPITAL_COMMUNITY): Payer: Self-pay | Admitting: General Surgery

## 2014-07-03 ENCOUNTER — Encounter (INDEPENDENT_AMBULATORY_CARE_PROVIDER_SITE_OTHER): Payer: Self-pay | Admitting: General Surgery

## 2014-07-03 ENCOUNTER — Ambulatory Visit (INDEPENDENT_AMBULATORY_CARE_PROVIDER_SITE_OTHER): Payer: Medicare PPO | Admitting: General Surgery

## 2014-07-03 VITALS — BP 124/70 | HR 75 | Temp 97.5°F | Ht 67.0 in | Wt 242.8 lb

## 2014-07-03 DIAGNOSIS — Z9889 Other specified postprocedural states: Secondary | ICD-10-CM

## 2014-07-03 NOTE — Progress Notes (Signed)
Patient ID: Enid DerryClyde Roulhac, male   DOB: 02/07/1946, 68 y.o.   MRN: [id number] Post op course Patient is a 68 year old male status post laparoscopic umbilical hernia repair with mesh. Patient has been doing well postoperatively. He's had some soreness.  On Exam: We'll do clean dry and intact, there is no hernial palpation   Assessment and Plan 68 y/o status post laparoscopic umbilical hernia repair with mesh. 1. We discussed with her resections for another month. 2. Patient can followup as needed   Axel FillerArmando Saraphina Lauderbaugh, MD [facility] General [facility] Surgery Trauma & Emergency Surgery

## 2014-07-25 ENCOUNTER — Other Ambulatory Visit (HOSPITAL_COMMUNITY): Payer: Self-pay | Admitting: *Deleted

## 2014-07-25 DIAGNOSIS — I6529 Occlusion and stenosis of unspecified carotid artery: Secondary | ICD-10-CM

## 2014-07-30 ENCOUNTER — Ambulatory Visit (HOSPITAL_COMMUNITY): Payer: Medicare HMO | Attending: Cardiovascular Disease | Admitting: *Deleted

## 2014-07-30 DIAGNOSIS — I2510 Atherosclerotic heart disease of native coronary artery without angina pectoris: Secondary | ICD-10-CM | POA: Diagnosis not present

## 2014-07-30 DIAGNOSIS — I10 Essential (primary) hypertension: Secondary | ICD-10-CM | POA: Diagnosis not present

## 2014-07-30 DIAGNOSIS — E119 Type 2 diabetes mellitus without complications: Secondary | ICD-10-CM | POA: Diagnosis not present

## 2014-07-30 DIAGNOSIS — I6529 Occlusion and stenosis of unspecified carotid artery: Secondary | ICD-10-CM | POA: Diagnosis present

## 2014-07-30 DIAGNOSIS — R0989 Other specified symptoms and signs involving the circulatory and respiratory systems: Secondary | ICD-10-CM | POA: Diagnosis not present

## 2014-07-30 DIAGNOSIS — E785 Hyperlipidemia, unspecified: Secondary | ICD-10-CM | POA: Diagnosis not present

## 2014-07-30 NOTE — Progress Notes (Signed)
Carotid duplex complete 

## 2014-09-15 ENCOUNTER — Encounter: Payer: Self-pay | Admitting: Cardiovascular Disease

## 2014-09-15 ENCOUNTER — Ambulatory Visit (INDEPENDENT_AMBULATORY_CARE_PROVIDER_SITE_OTHER): Payer: Medicare HMO | Admitting: Cardiovascular Disease

## 2014-09-15 VITALS — BP 128/62 | HR 56 | Ht 67.0 in | Wt 251.0 lb

## 2014-09-15 DIAGNOSIS — E782 Mixed hyperlipidemia: Secondary | ICD-10-CM

## 2014-09-15 DIAGNOSIS — E119 Type 2 diabetes mellitus without complications: Secondary | ICD-10-CM

## 2014-09-15 DIAGNOSIS — E669 Obesity, unspecified: Secondary | ICD-10-CM

## 2014-09-15 DIAGNOSIS — R0989 Other specified symptoms and signs involving the circulatory and respiratory systems: Secondary | ICD-10-CM

## 2014-09-15 DIAGNOSIS — I2583 Coronary atherosclerosis due to lipid rich plaque: Secondary | ICD-10-CM

## 2014-09-15 DIAGNOSIS — I2510 Atherosclerotic heart disease of native coronary artery without angina pectoris: Secondary | ICD-10-CM

## 2014-09-15 DIAGNOSIS — I10 Essential (primary) hypertension: Secondary | ICD-10-CM

## 2014-09-15 NOTE — Assessment & Plan Note (Signed)
Stable with no angina and good activity level.  Continue medical Rx  

## 2014-09-15 NOTE — Assessment & Plan Note (Signed)
Discussed low carb diet.  Target hemoglobin A1c is 6.5 or less.  Continue current medications.  

## 2014-09-15 NOTE — Patient Instructions (Signed)
Your physician wants you to follow-up in:  6 MONTHS WITH DR NISHAN  You will receive a reminder letter in the mail two months in advance. If you don't receive a letter, please call our office to schedule the follow-up appointment. Your physician recommends that you continue on your current medications as directed. Please refer to the Current Medication list given to you today. 

## 2014-09-15 NOTE — Assessment & Plan Note (Signed)
Well controlled.  Continue current medications and low sodium Dash type diet.    

## 2014-09-15 NOTE — Assessment & Plan Note (Signed)
Discussed exercise program and low carb diet  Wife on doctor prescribed diet so they are eating better  Discussed return of hernia if he doesn't lose weight

## 2014-09-15 NOTE — Assessment & Plan Note (Signed)
Duplex 07/30/14 with right ICA 40-59%  F/u in a year bruit not heart today  ASA

## 2014-09-15 NOTE — Progress Notes (Signed)
Patient ID: Enid DerryClyde Owczarzak, male   DOB: 1946-03-11, 68 y.o.   MRN: [id number] Enid DerryClyde Aaberg is a 68 y.o. male who returns for f/u CAD   He has a history of DM2, HTN, HL, CAD. 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 He establish with me  05/07/12. He denied chest pain but did have some exertional dyspnea. Follow up Myoview performed 05/14/12 was abnormal: inferior and apical defect consistent with prior inferior and apical infarct and mild peri-infarct ischemia, EF 52%. Therefore, cardiac catheterization was recommended.   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 Isosorbide 30 mg daily given for his myocardial bridging and evidence of diffuse distal diabetic disease on his cardiac catheterization .  The patient denies chest pain, shortness of breath, syncope, orthopnea, PND or significant pedal edema. Tolerating isosorbide well. A1C about 6.3 in June   Needs SL nitro  Labs June LDL 55 with TC 141   Has nitro he carries with him no need for refills Compliant with meds Trying to watch carb/weight   Recent umbilical hernia repair without complications     ROS: Denies fever, malais, weight loss, blurry vision, decreased visual acuity, cough, sputum, SOB, hemoptysis, pleuritic pain, palpitaitons, heartburn, abdominal pain, melena, lower extremity edema, claudication, or rash.  All other systems reviewed and negative  General: Affect appropriate Obese whtie male  HEENT: normal Neck supple with no adenopathy JVP normal no bruits no thyromegaly Lungs clear with no wheezing and good diaphragmatic motion Heart:  S1/S2 no murmur, no rub, gallop or click PMI normal Abdomen: benighn, BS positve, no tenderness, no AAA S/P lap umbilical hernia repair  no bruit.  No HSM or HJR Distal pulses intact with no bruits No edema Neuro non-focal Skin  warm and dry No muscular weakness   Current Outpatient Prescriptions  Medication Sig Dispense Refill  . aspirin 81 MG tablet Take 81 mg by mouth daily.      . clopidogrel (PLAVIX) 75 MG tablet Take 75 mg by mouth daily.      . cyanocobalamin (,VITAMIN B-12,) 1000 MCG/ML injection Inject 1,000 mcg into the muscle every 30 (thirty) days. 1st of each month      . hydrochlorothiazide (MICROZIDE) 12.5 MG capsule Take 12.5 mg by mouth daily.      . isosorbide mononitrate (IMDUR) 30 MG 24 hr tablet Take 1 tablet (30 mg total) by mouth daily.  90 tablet  3  . lisinopril (PRINIVIL,ZESTRIL) 30 MG tablet Take 30 mg by mouth daily.       . metFORMIN (GLUCOPHAGE) 1000 MG tablet Take 1,000 mg by mouth 2 (two) times daily with a meal.      . metoprolol (LOPRESSOR) 50 MG tablet Take 50 mg by mouth 2 (two) times daily.      Marland Kitchen. NASONEX 50 MCG/ACT nasal spray Place 2 sprays into the nose daily as needed (for allergies).       . nitroGLYCERIN (NITROSTAT) 0.4 MG SL tablet Place 1 tablet (0.4 mg total) under the tongue every 5 (five) minutes as needed for chest pain.  25 tablet  4  . oxyCODONE-acetaminophen (ROXICET) 5-325 MG per tablet Take 1-2 tablets by mouth every 4 (four) hours as needed.  30 tablet  0  . pravastatin (PRAVACHOL) 40 MG tablet Take 40 mg by mouth daily.       No  current facility-administered medications for this visit.    Allergies  Review of patient's allergies indicates no known allergies.  Electrocardiogram:  SR PAC LAD   Assessment and Plan

## 2014-09-15 NOTE — Assessment & Plan Note (Signed)
Cholesterol is at goal.  Continue current dose of statin and diet Rx.  No myalgias or side effects.  F/U  LFT's in 6 months. No results found for this basename: LDLCALC             

## 2014-11-03 ENCOUNTER — Other Ambulatory Visit: Payer: Self-pay | Admitting: Cardiovascular Disease

## 2015-03-13 NOTE — Progress Notes (Signed)
Patient ID: Larry Hodges, male   DOB: 19-Apr-1946, 69 y.o.   MRN: [id number] Larry Hodges is a  69 y.o.  male who returns for f/u CAD   He has a history of DM2, HTN, HL, CAD. 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 He establish with me  05/07/12. He denied chest pain but did have some exertional dyspnea. Follow up Myoview performed 05/14/12 was abnormal: inferior and apical defect consistent with prior inferior and apical infarct and mild peri-infarct ischemia, EF 52%. Therefore, cardiac catheterization was recommended.   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 Isosorbide 30 mg daily given for his myocardial bridging and evidence of diffuse distal diabetic disease on his cardiac catheterization .  The patient denies chest pain, shortness of breath, syncope, orthopnea, PND or significant pedal edema. Tolerating isosorbide well. A1C about 6.3 in June   Needs SL nitro  Labs June LDL 55 with TC 141   Has nitro he carries with him no need for refills Compliant with meds Trying to watch carb/weight   06/13/14  umbilical hernia repair without complications  Enjoys tractor pulls does it with son  Has lots of trophies     ROS: Denies fever, malais, weight loss, blurry vision, decreased visual acuity, cough, sputum, SOB, hemoptysis, pleuritic pain, palpitaitons, heartburn, abdominal pain, melena, lower extremity edema, claudication, or rash.  All other systems reviewed and negative  General: Affect appropriate Obese whtie male  HEENT: normal Neck supple with no adenopathy JVP normal no bruits no thyromegaly Lungs clear with no wheezing and good diaphragmatic motion Heart:  S1/S2 no murmur, no rub, gallop or click PMI normal Abdomen: benighn, BS positve, no tenderness, no AAA S/P lap umbilical hernia repair  no bruit.  No HSM or HJR Distal  pulses intact with no bruits No edema Neuro non-focal Skin warm and dry No muscular weakness   Current Outpatient Prescriptions  Medication Sig Dispense Refill  . aspirin 81 MG tablet Take 81 mg by mouth daily.    . Cholecalciferol (VITAMIN D3) 50000 UNITS CAPS Take 50,000 Units by mouth once a week.    . clopidogrel (PLAVIX) 75 MG tablet Take 75 mg by mouth daily.    . cyanocobalamin (,VITAMIN B-12,) 1000 MCG/ML injection Inject 1,000 mcg into the muscle every 30 (thirty) days. 1st of each month    . hydrochlorothiazide (MICROZIDE) 12.5 MG capsule Take 12.5 mg by mouth daily.    . INVOKANA 100 MG TABS tablet Take 100 mg by mouth daily.    . isosorbide mononitrate (IMDUR) 30 MG 24 hr tablet TAKE 1 TABLET EVERY DAY 90 tablet 1  . lisinopril (PRINIVIL,ZESTRIL) 30 MG tablet Take 30 mg by mouth daily.     . metFORMIN (GLUCOPHAGE) 1000 MG tablet Take 1,000 mg by mouth 2 (two) times daily with a meal.    . metoprolol (LOPRESSOR) 50 MG tablet Take 50 mg by mouth 2 (two) times daily.    Marland Kitchen NASONEX 50 MCG/ACT nasal spray Place 2 sprays into the nose daily as needed (for allergies).     . nitroGLYCERIN (NITROSTAT) 0.4 MG SL tablet Place 1 tablet (0.4 mg total) under the tongue every 5 (five) minutes as needed for chest pain. 25 tablet 4  . oxyCODONE-acetaminophen (ROXICET) 5-325 MG per tablet Take 1-2 tablets by mouth every 4 (four) hours as needed.  30 tablet 0  . pravastatin (PRAVACHOL) 40 MG tablet Take 40 mg by mouth daily.     No current facility-administered medications for this visit.    Allergies  Review of patient's allergies indicates no known allergies.  Electrocardiogram:   09/15/14   SR PAC LAD 03/16/15  SR rate 67 LAD IVCD   Assessment and Plan CAD:  Last cath 3013 non obstructive diffuse distal diabetic disease and distal LAD bridging continue medical Rx Obesity:  Discussed exercise and low carb diet Chol:  On statin Cholesterol is at goal.  Continue current dose of statin and  diet Rx.  No myalgias or side effects.  F/U  LFT's in 6 months. [facility] Edema:  Dependant from obesity continue low dose diuretic HTN:  Well controlled.  Continue current medications and low sodium Dash type diet.

## 2015-03-16 ENCOUNTER — Ambulatory Visit (INDEPENDENT_AMBULATORY_CARE_PROVIDER_SITE_OTHER): Payer: Medicare HMO | Admitting: Cardiovascular Disease

## 2015-03-16 ENCOUNTER — Encounter: Payer: Self-pay | Admitting: Cardiovascular Disease

## 2015-03-16 VITALS — BP 108/50 | HR 67 | Ht 67.0 in | Wt 246.8 lb

## 2015-03-16 DIAGNOSIS — I2583 Coronary atherosclerosis due to lipid rich plaque: Principal | ICD-10-CM

## 2015-03-16 DIAGNOSIS — I2510 Atherosclerotic heart disease of native coronary artery without angina pectoris: Secondary | ICD-10-CM

## 2015-03-16 DIAGNOSIS — I10 Essential (primary) hypertension: Secondary | ICD-10-CM | POA: Diagnosis not present

## 2015-03-16 NOTE — Patient Instructions (Signed)
Medication Instructions:  NO CHANGES  Labwork: NONE  Testing/Procedures: NONE  Follow-Up: Your physician wants you to follow-up in: 6 MONTHS WITH DR NISHAN You will receive a reminder letter in the mail two months in advance. If you don't receive a letter, please call our office to schedule the follow-up appointment.  Any Other Special Instructions Will Be Listed Below (If Applicable).   

## 2015-07-21 ENCOUNTER — Other Ambulatory Visit: Payer: Self-pay | Admitting: Cardiovascular Disease

## 2015-08-13 ENCOUNTER — Other Ambulatory Visit: Payer: Self-pay | Admitting: Cardiovascular Disease

## 2015-08-13 DIAGNOSIS — I6523 Occlusion and stenosis of bilateral carotid arteries: Secondary | ICD-10-CM

## 2015-08-17 ENCOUNTER — Ambulatory Visit (HOSPITAL_COMMUNITY)
Admission: RE | Admit: 2015-08-17 | Discharge: 2015-08-17 | Disposition: A | Discharge status: Home / Self Care | Payer: Medicare HMO | Source: Ambulatory Visit | Attending: Cardiovascular Disease | Admitting: Cardiovascular Disease

## 2015-08-17 DIAGNOSIS — E119 Type 2 diabetes mellitus without complications: Secondary | ICD-10-CM | POA: Diagnosis not present

## 2015-08-17 DIAGNOSIS — E785 Hyperlipidemia, unspecified: Secondary | ICD-10-CM | POA: Diagnosis not present

## 2015-08-17 DIAGNOSIS — I10 Essential (primary) hypertension: Secondary | ICD-10-CM | POA: Diagnosis not present

## 2015-08-17 DIAGNOSIS — I2510 Atherosclerotic heart disease of native coronary artery without angina pectoris: Secondary | ICD-10-CM | POA: Insufficient documentation

## 2015-08-17 DIAGNOSIS — I6523 Occlusion and stenosis of bilateral carotid arteries: Secondary | ICD-10-CM | POA: Diagnosis not present

## 2015-10-01 ENCOUNTER — Encounter: Payer: Self-pay | Admitting: *Deleted

## 2015-10-02 ENCOUNTER — Ambulatory Visit (INDEPENDENT_AMBULATORY_CARE_PROVIDER_SITE_OTHER): Payer: Medicare HMO | Admitting: Cardiovascular Disease

## 2015-10-02 ENCOUNTER — Encounter: Payer: Self-pay | Admitting: Cardiovascular Disease

## 2015-10-02 VITALS — BP 134/60 | HR 58 | Ht 67.0 in | Wt 240.8 lb

## 2015-10-02 DIAGNOSIS — I6529 Occlusion and stenosis of unspecified carotid artery: Secondary | ICD-10-CM

## 2015-10-02 DIAGNOSIS — I10 Essential (primary) hypertension: Secondary | ICD-10-CM | POA: Diagnosis not present

## 2015-10-02 NOTE — Progress Notes (Signed)
Patient ID: Larry Hodges, male   DOB: 06/20/1946, 69 y.o.   MRN: [id number] Larry Hodges is a  69 y.o.  male who returns for f/u CAD   He has a history of DM2, HTN, HL, CAD. 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 He establish with me  05/07/12. He denied chest pain but did have some exertional dyspnea. Follow up Myoview performed 05/14/12 was abnormal: inferior and apical defect consistent with prior inferior and apical infarct and mild peri-infarct ischemia, EF 52%. Therefore, cardiac catheterization was recommended.   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 Isosorbide 30 mg daily given for his myocardial bridging and evidence of diffuse distal diabetic disease on his cardiac catheterization .  The patient denies chest pain, shortness of breath, syncope, orthopnea, PND or significant pedal edema. Tolerating isosorbide well. A1C about 6.3 in June   Needs SL nitro  Labs June LDL 55 with TC 141   Has nitro he carries with him no need for refills Compliant with meds Trying to watch carb/weight   06/13/14  umbilical hernia repair without complications  Enjoys tractor pulls does it with son  Has lots of trophies   Last carotid reviewed June 2016  60-79% bilateral disease stable    ROS: Denies fever, malais, weight loss, blurry vision, decreased visual acuity, cough, sputum, SOB, hemoptysis, pleuritic pain, palpitaitons, heartburn, abdominal pain, melena, lower extremity edema, claudication, or rash.  All other systems reviewed and negative  General: Affect appropriate Obese whtie male  HEENT: normal Neck supple with no adenopathy JVP normal left  bruits no thyromegaly Lungs clear with no wheezing and good diaphragmatic motion Heart:  S1/S2 no murmur, no rub, gallop or click PMI normal Abdomen: benighn, BS positve, no tenderness, no  AAA S/P lap umbilical hernia repair  no bruit.  No HSM or HJR Distal pulses intact with no bruits No edema Neuro non-focal Skin warm and dry No muscular weakness   Current Outpatient Prescriptions  Medication Sig Dispense Refill  . aspirin 81 MG tablet Take 81 mg by mouth daily.    . Cholecalciferol (VITAMIN D3) 50000 UNITS CAPS Take 50,000 Units by mouth once a week.    . clopidogrel (PLAVIX) 75 MG tablet Take 75 mg by mouth daily.    . cyanocobalamin (,VITAMIN B-12,) 1000 MCG/ML injection Inject 1,000 mcg into the muscle every 30 (thirty) days. 1st of each month    . hydrochlorothiazide (HYDRODIURIL) 25 MG tablet Take 25 mg by mouth daily.    . INVOKANA 100 MG TABS tablet Take 100 mg by mouth daily.    . isosorbide mononitrate (IMDUR) 30 MG 24 hr tablet Take 30 mg by mouth daily.    Marland Kitchen. lisinopril (PRINIVIL,ZESTRIL) 30 MG tablet Take 30 mg by mouth daily.     . metFORMIN (GLUCOPHAGE) 1000 MG tablet Take 1,000 mg by mouth 2 (two) times daily with a meal.    . metoprolol (LOPRESSOR) 50 MG tablet Take 50 mg by mouth 2 (two) times daily.    Marland Kitchen. NASONEX 50 MCG/ACT nasal spray Place 2 sprays into the nose daily as needed (for allergies).     . nitroGLYCERIN (NITROSTAT) 0.4 MG SL tablet Place 0.4 mg under the tongue every 5 (five) minutes as needed for chest pain (3 doses max).    Marland Kitchen. oxyCODONE-acetaminophen (PERCOCET/ROXICET) 5-325 MG tablet Take  1-2 tablets by mouth every 4 (four) hours as needed for moderate pain or severe pain.    . pravastatin (PRAVACHOL) 40 MG tablet Take 40 mg by mouth daily.     No current facility-administered medications for this visit.    Allergies  Review of patient's allergies indicates no known allergies.  Electrocardiogram:   09/15/14   SR PAC LAD 03/16/15  SR rate 67 LAD IVCD   Assessment and Plan CAD:  Last cath 3013 non obstructive diffuse distal diabetic disease and distal LAD bridging continue medical Rx Obesity:  Discussed exercise and low carb  diet Chol:  On statin Cholesterol is at goal.  Continue current dose of statin and diet Rx.  No myalgias or side effects.  F/U  LFT's in 6 months. No results found for: [facility] labs with primary Edema:  Dependant from obesity continue low dose diuretic HTN:  Well controlled.  Continue current medications and low sodium Dash type diet.   Carotid:  Left bruit 04/2015 60-79% bilateral disease f/u duplex in March 2017          F/U with me in a year  [name]

## 2015-10-02 NOTE — Patient Instructions (Signed)
Medication Instructions:  NO CHANGES Labwork: NONE  Testing/Procedures: Your physician has requested that you have a carotid duplex. This test is an ultrasound of the carotid arteries in your neck. It looks at blood flow through these arteries that supply the brain with blood. Allow one hour for this exam. There are no restrictions or special instructions.   DUE IN MARCH Follow-Up: Your physician wants you to follow-up in:  MARCH WITH   DR Eden EmmsNISHAN  CAROTID  SAME DAY  You will receive a reminder letter in the mail two months in advance. If you don't receive a letter, please call our office to schedule the follow-up appointment.  Any Other Special Instructions Will Be Listed Below (If Applicable).     If you need a refill on your cardiac medications before your next appointment, please call your pharmacy.

## 2015-10-28 ENCOUNTER — Encounter: Payer: Self-pay | Admitting: Cardiovascular Disease

## 2016-01-25 ENCOUNTER — Other Ambulatory Visit: Payer: Self-pay | Admitting: Cardiovascular Disease

## 2016-02-12 ENCOUNTER — Ambulatory Visit (HOSPITAL_COMMUNITY)
Admission: RE | Admit: 2016-02-12 | Discharge: 2016-02-12 | Disposition: A | Discharge status: Home / Self Care | Payer: Medicare HMO | Source: Ambulatory Visit | Attending: Cardiology | Admitting: Cardiology

## 2016-02-12 DIAGNOSIS — G4730 Sleep apnea, unspecified: Secondary | ICD-10-CM | POA: Insufficient documentation

## 2016-02-12 DIAGNOSIS — E119 Type 2 diabetes mellitus without complications: Secondary | ICD-10-CM | POA: Diagnosis not present

## 2016-02-12 DIAGNOSIS — I10 Essential (primary) hypertension: Secondary | ICD-10-CM | POA: Diagnosis not present

## 2016-02-12 DIAGNOSIS — I6529 Occlusion and stenosis of unspecified carotid artery: Secondary | ICD-10-CM

## 2016-02-12 DIAGNOSIS — E785 Hyperlipidemia, unspecified: Secondary | ICD-10-CM | POA: Diagnosis not present

## 2016-02-12 DIAGNOSIS — R938 Abnormal findings on diagnostic imaging of other specified body structures: Secondary | ICD-10-CM | POA: Diagnosis not present

## 2016-02-12 DIAGNOSIS — I6523 Occlusion and stenosis of bilateral carotid arteries: Secondary | ICD-10-CM | POA: Insufficient documentation

## 2016-02-12 DIAGNOSIS — I2510 Atherosclerotic heart disease of native coronary artery without angina pectoris: Secondary | ICD-10-CM | POA: Diagnosis not present

## 2016-02-13 NOTE — Progress Notes (Signed)
Patient ID: Larry Hodges, male   DOB: June 07, 1946, 70 y.o.   MRN: [id number] Larry Hodges is a  70 y.o.  male who returns for f/u CAD   70 y.o. with  a history of DM2, HTN, HL, CAD. 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 He establish with me  05/07/12. He denied chest pain but did have some exertional dyspnea. Follow up Myoview performed 05/14/12 was abnormal: inferior and apical defect consistent with prior inferior and apical infarct and mild peri-infarct ischemia, EF 52%. Therefore, cardiac catheterization was recommended.   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 Isosorbide 30 mg daily given for his myocardial bridging and evidence of diffuse distal diabetic disease on his cardiac catheterization .  The patient denies chest pain, shortness of breath, syncope, orthopnea, PND or significant pedal edema. Tolerating isosorbide well. 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  Some cramps in legs ? Needing stronger diuretic   ROS: Denies fever, malais, weight loss, blurry vision, decreased visual acuity, cough, sputum, SOB, hemoptysis, pleuritic pain, palpitaitons, heartburn, abdominal pain, melena, lower extremity edema, claudication, or rash.  All other systems reviewed and negative  General: Affect appropriate Obese whtie male  HEENT: normal Neck supple with no adenopathy JVP normal left  bruits no thyromegaly Lungs clear with no wheezing and good diaphragmatic motion Heart:  S1/S2 no murmur, no rub, gallop or click PMI normal Abdomen: benighn, BS  positve, no tenderness, no AAA S/P lap umbilical hernia repair  no bruit.  No HSM or HJR Distal pulses intact with no bruits No edema Neuro non-focal Skin warm and dry No muscular weakness   Current Outpatient Prescriptions  Medication Sig Dispense Refill  . aspirin 81 MG tablet Take 81 mg by mouth daily.    . Cholecalciferol (VITAMIN D3) 50000 UNITS CAPS Take 50,000 Units by mouth once a week.    . clopidogrel (PLAVIX) 75 MG tablet Take 75 mg by mouth daily.    . cyanocobalamin (,VITAMIN B-12,) 1000 MCG/ML injection Inject 1,000 mcg into the muscle every 30 (thirty) days. 1st of each month    . hydrochlorothiazide (HYDRODIURIL) 25 MG tablet Take 25 mg by mouth daily.    . INVOKANA 100 MG TABS tablet Take 100 mg by mouth daily.    . isosorbide mononitrate (IMDUR) 30 MG 24 hr tablet Take 1 tablet (30 mg total) by mouth daily. 90 tablet 2  . lisinopril (PRINIVIL,ZESTRIL) 30 MG tablet Take 30 mg by mouth daily.     . metFORMIN (GLUCOPHAGE) 1000 MG tablet Take 1,000 mg by mouth 2 (two) times daily with a meal.    . metoprolol (LOPRESSOR) 50 MG tablet Take 50 mg by mouth 2 (two) times daily.    Marland Kitchen NASONEX 50 MCG/ACT nasal spray Place 2 sprays into the nose daily as needed (for allergies).     . nitroGLYCERIN (NITROSTAT) 0.4 MG SL tablet Place 0.4 mg under the tongue every 5 (five) minutes as needed for chest pain (3 doses max).    Marland Kitchen  oxyCODONE-acetaminophen (PERCOCET/ROXICET) 5-325 MG tablet Take 1-2 tablets by mouth every 4 (four) hours as needed for moderate pain or severe pain.    . pravastatin (PRAVACHOL) 40 MG tablet Take 40 mg by mouth daily.     No current facility-administered medications for this visit.    Allergies  Review of patient's allergies indicates no known allergies.  Electrocardiogram:   09/15/14   SR PAC LAD 03/16/15  SR rate 67 LAD IVCD   Assessment and Plan CAD:  Last cath 3013 non obstructive diffuse distal diabetic disease and distal LAD bridging continue medical  Rx  Obesity:  Discussed exercise and low carb diet  Chol:  On statin Cholesterol is at goal.  Continue current dose of statin and diet Rx.  No myalgias or side effects.  F/U  LFT's in 6 months. No results found for: [facility] with primary  Edema:  Dependant from obesity Check BMET and Mag  Consider changing to lasix  Advised Magoxide over counter   HTN:  Well controlled.  Continue current medications and low sodium Dash type diet.    Carotid:  Left bruit 04/2015 40-59% RICA  disease f/u duplex in March 2018          F/U with me in 6 months   Charlton HawsPeter Nishan

## 2016-02-15 ENCOUNTER — Ambulatory Visit (INDEPENDENT_AMBULATORY_CARE_PROVIDER_SITE_OTHER): Payer: Medicare HMO | Admitting: Cardiovascular Disease

## 2016-02-15 ENCOUNTER — Encounter: Payer: Self-pay | Admitting: Cardiovascular Disease

## 2016-02-15 VITALS — BP 120/50 | HR 63 | Ht 67.0 in | Wt 241.2 lb

## 2016-02-15 DIAGNOSIS — I10 Essential (primary) hypertension: Secondary | ICD-10-CM | POA: Diagnosis not present

## 2016-02-15 DIAGNOSIS — I2510 Atherosclerotic heart disease of native coronary artery without angina pectoris: Secondary | ICD-10-CM | POA: Diagnosis not present

## 2016-02-15 DIAGNOSIS — I2583 Coronary atherosclerosis due to lipid rich plaque: Principal | ICD-10-CM

## 2016-02-15 LAB — BASIC METABOLIC PANEL
BUN: 21 mg/dL (ref 7–25)
CALCIUM: 9.9 mg/dL (ref 8.6–10.3)
CO2: 27 mmol/L (ref 20–31)
Chloride: 101 mmol/L (ref 98–110)
Creat: 1.33 mg/dL — ABNORMAL HIGH (ref 0.70–1.25)
GLUCOSE: 92 mg/dL (ref 65–99)
Potassium: 4.1 mmol/L (ref 3.5–5.3)
SODIUM: 138 mmol/L (ref 135–146)

## 2016-02-15 LAB — MAGNESIUM: Magnesium: 1.8 mg/dL (ref 1.5–2.5)

## 2016-02-15 NOTE — Patient Instructions (Signed)
Medication Instructions:  Your physician recommends that you continue on your current medications as directed. Please refer to the Current Medication list given to you today.  Labwork: Your physician recommends that you have lab work today bmet and mg  Testing/Procedures: NONE  Follow-Up: Your physician wants you to follow-up in: 6 months with Dr. Eden EmmsNishan. You will receive a reminder letter in the mail two months in advance. If you don't receive a letter, please call our office to schedule the follow-up appointment.  If you need a refill on your cardiac medications before your next appointment, please call your pharmacy.

## 2016-03-17 ENCOUNTER — Emergency Department (HOSPITAL_COMMUNITY): Payer: Medicare HMO

## 2016-03-17 ENCOUNTER — Emergency Department (HOSPITAL_COMMUNITY)
Admission: EM | Admit: 2016-03-17 | Discharge: 2016-03-17 | Disposition: A | Discharge status: Home / Self Care | Payer: Medicare HMO | Attending: Emergency Medicine | Admitting: Emergency Medicine

## 2016-03-17 ENCOUNTER — Encounter (HOSPITAL_COMMUNITY): Payer: Self-pay | Admitting: Emergency Medicine

## 2016-03-17 DIAGNOSIS — Z79899 Other long term (current) drug therapy: Secondary | ICD-10-CM | POA: Diagnosis not present

## 2016-03-17 DIAGNOSIS — I10 Essential (primary) hypertension: Secondary | ICD-10-CM | POA: Insufficient documentation

## 2016-03-17 DIAGNOSIS — Z9889 Other specified postprocedural states: Secondary | ICD-10-CM | POA: Diagnosis not present

## 2016-03-17 DIAGNOSIS — Z87891 Personal history of nicotine dependence: Secondary | ICD-10-CM | POA: Insufficient documentation

## 2016-03-17 DIAGNOSIS — M62830 Muscle spasm of back: Secondary | ICD-10-CM | POA: Insufficient documentation

## 2016-03-17 DIAGNOSIS — M545 Low back pain: Secondary | ICD-10-CM | POA: Diagnosis present

## 2016-03-17 DIAGNOSIS — I252 Old myocardial infarction: Secondary | ICD-10-CM | POA: Diagnosis not present

## 2016-03-17 DIAGNOSIS — Z7984 Long term (current) use of oral hypoglycemic drugs: Secondary | ICD-10-CM | POA: Diagnosis not present

## 2016-03-17 DIAGNOSIS — I639 Cerebral infarction, unspecified: Secondary | ICD-10-CM | POA: Diagnosis not present

## 2016-03-17 DIAGNOSIS — I2510 Atherosclerotic heart disease of native coronary artery without angina pectoris: Secondary | ICD-10-CM | POA: Diagnosis not present

## 2016-03-17 DIAGNOSIS — Z7982 Long term (current) use of aspirin: Secondary | ICD-10-CM | POA: Diagnosis not present

## 2016-03-17 DIAGNOSIS — Z79891 Long term (current) use of opiate analgesic: Secondary | ICD-10-CM | POA: Diagnosis not present

## 2016-03-17 DIAGNOSIS — E119 Type 2 diabetes mellitus without complications: Secondary | ICD-10-CM | POA: Diagnosis not present

## 2016-03-17 DIAGNOSIS — E785 Hyperlipidemia, unspecified: Secondary | ICD-10-CM | POA: Diagnosis not present

## 2016-03-17 DIAGNOSIS — E669 Obesity, unspecified: Secondary | ICD-10-CM | POA: Insufficient documentation

## 2016-03-17 MED ORDER — HYDROCODONE-ACETAMINOPHEN 5-325 MG PO TABS
1.0000 | ORAL_TABLET | Freq: Four times a day (QID) | ORAL | Status: DC | PRN
Start: 2016-03-17 — End: 2016-08-08

## 2016-03-17 MED ORDER — HYDROCODONE-ACETAMINOPHEN 5-325 MG PO TABS
2.0000 | ORAL_TABLET | Freq: Once | ORAL | Status: AC
  Administered 2016-03-17: 2 via ORAL
  Filled 2016-03-17: qty 2

## 2016-03-17 MED ORDER — IBUPROFEN 800 MG PO TABS
800.0000 mg | ORAL_TABLET | Freq: Once | ORAL | Status: AC
  Administered 2016-03-17: 800 mg via ORAL
  Filled 2016-03-17: qty 1

## 2016-03-17 MED ORDER — CYCLOBENZAPRINE HCL 10 MG PO TABS: 10.0000 mg | ORAL_TABLET | Freq: Two times a day (BID) | ORAL | Status: DC | PRN

## 2016-03-17 MED ORDER — DIAZEPAM 5 MG/ML IJ SOLN
5.0000 mg | Freq: Once | INTRAMUSCULAR | Status: AC
  Administered 2016-03-17: 5 mg via INTRAMUSCULAR
  Filled 2016-03-17: qty 2

## 2016-03-17 NOTE — Discharge Instructions (Signed)

## 2016-03-17 NOTE — ED Notes (Signed)
Pt is still trying

## 2016-03-17 NOTE — ED Notes (Addendum)
Per EMS pt reports that pt has been having increasing back pain for the last 3 days. Pt reports lower back pain with no radiation into legs also denies any recent injury. Pt denies any urinary symptoms at this time.

## 2016-03-17 NOTE — ED Notes (Signed)
MD at bedside. 

## 2016-03-17 NOTE — ED Notes (Addendum)
Going over d/c instructions.  Pt is lying flat and states his pain level is better, now 8/10.  States he hasn't been up to walk yet.  As head of bed was raised a few inches to have pt get out of bed to walk, he winced in pain and wasn't able to come up any higher.  Has asked to give him some more time.  Will report to primary RN and EDP

## 2016-03-17 NOTE — ED Notes (Signed)
Bed: [id number] Expected date:  Expected time:  Means of arrival:  Comments: EMS 70 yo male from home back pain x 2 1/2 days

## 2016-03-17 NOTE — ED Provider Notes (Signed)
CSN: [id number]     Arrival date & time 03/17/16  0522 History   First MD Initiated Contact with Patient 03/17/16 0557     Chief Complaint  Patient presents with  . Back Pain     (Consider location/radiation/quality/duration/timing/severity/associated sxs/prior Treatment) HPI Comments: Patient presents to the emergency department with chief complaint low back pain. He states pain is mostly located on the left low back. He reports having been cleaning his shop for the past several days, lifting multiple tools and organizing things. He has not tried taking anything for symptoms. He denies any radiating pain. Denies any numbness, weakness, tingling, bowel or bladder incontinence. Denies any history of the same. Denies any fevers chills. His pain is worsened with palpation, bending, and moving.  There are no other associated symptoms.  The history is provided by the patient. No language interpreter was used.    Past Medical History  Diagnosis Date  . DM (diabetes mellitus) (HCC)   . HTN (hypertension)   . Hyperlipidemia   . CAD (coronary artery disease)     remote MI 2005 in [place] - no PCI;  Myoview performed 05/14/12 was abnormal: inferior and apical defect consistent with prior inferior and apical infarct and mild peri-infarct ischemia, EF 52%.  Therefore, cardiac catheterization was recommended.  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 -  med Rx  . Obesity   . Myocardial infarction (HCC)   . Stroke [facility])     states mini strokes  . Sleep apnea   . Arthritis    Past Surgical History  Procedure Laterality Date  . Shoulder surgery    . Hernia repair    . Knee surgery Bilateral     Torn ligament both knees  . Umbilical hernia repair N/A 06/13/2014    Procedure: LAPAROSCOPIC UMBILICAL HERNIA;  Surgeon: Axel Filler, MD;  Location: [facility] OR;  Service: General;  Laterality: N/A;  . Insertion of  mesh N/A 06/13/2014    Procedure: INSERTION OF MESH;  Surgeon: Axel Filler, MD;  Location: [facility] OR;  Service: General;  Laterality: N/A;   Family History  Problem Relation Age of Onset  . Hypertension    . Kidney disease    . Hypertension Mother   . Kidney disease Father    Social History  Substance Use Topics  . Smoking status: Former Smoker    Types: Cigarettes    Quit date: 12/01/2003  . Smokeless tobacco: Never Used  . Alcohol Use: No    Review of Systems  Constitutional: Negative for fever and chills.  Gastrointestinal:       No bowel incontinence  Genitourinary:       No urinary incontinence  Musculoskeletal: Positive for myalgias, back pain and arthralgias.  Neurological:       No saddle anesthesia      Allergies  Review of patient's allergies indicates no known allergies.  Home Medications   Prior to Admission medications   Medication Sig Start Date End Date Taking? Authorizing Provider  aspirin 81 MG tablet Take 81 mg by mouth daily.    Historical Provider, MD  Cholecalciferol (VITAMIN D3) 50000 UNITS CAPS Take 50,000 Units by mouth once a week. 02/06/15   Historical Provider, MD  clopidogrel (PLAVIX) 75 MG tablet Take 75 mg by mouth daily.    Historical Provider, MD  cyanocobalamin (,VITAMIN B-12,) 1000 MCG/ML injection Inject 1,000 mcg into  the muscle every 30 (thirty) days. 1st of each month 05/06/14   Historical Provider, MD  hydrochlorothiazide (HYDRODIURIL) 25 MG tablet Take 25 mg by mouth daily. 07/20/15   Historical Provider, MD  INVOKANA 100 MG TABS tablet Take 100 mg by mouth daily. 02/05/15   Historical Provider, MD  isosorbide mononitrate (IMDUR) 30 MG 24 hr tablet Take 1 tablet (30 mg total) by mouth daily. 01/26/16   Wendall Stade, MD  lisinopril (PRINIVIL,ZESTRIL) 30 MG tablet Take 30 mg by mouth daily.  04/04/14   Historical Provider, MD  metFORMIN (GLUCOPHAGE) 1000 MG tablet Take 1,000 mg by mouth 2 (two) times daily with a meal.    Historical  Provider, MD  metoprolol (LOPRESSOR) 50 MG tablet Take 50 mg by mouth 2 (two) times daily.    Historical Provider, MD  NASONEX 50 MCG/ACT nasal spray Place 2 sprays into the nose daily as needed (for allergies).  04/16/14   Historical Provider, MD  nitroGLYCERIN (NITROSTAT) 0.4 MG SL tablet Place 0.4 mg under the tongue every 5 (five) minutes as needed for chest pain (3 doses max).    Historical Provider, MD  oxyCODONE-acetaminophen (PERCOCET/ROXICET) 5-325 MG tablet Take 1-2 tablets by mouth every 4 (four) hours as needed for moderate pain or severe pain.    Historical Provider, MD  pravastatin (PRAVACHOL) 40 MG tablet Take 40 mg by mouth daily.    Historical Provider, MD   BP 146/83 mmHg  Pulse 63  Temp(Src) 97.7 F (36.5 C) (Oral)  Resp 18  Ht  (1.702 m)  Wt 107.956 kg  BMI 37.27 kg/m2  SpO2 96% Physical Exam  Constitutional: He is oriented to person, place, and time. He appears well-developed and well-nourished. No distress.  HENT:  Head: Normocephalic and atraumatic.  Eyes: Conjunctivae and EOM are normal. Right eye exhibits no discharge. Left eye exhibits no discharge. No scleral icterus.  Neck: Normal range of motion. Neck supple. No tracheal deviation present.  Cardiovascular: Normal rate, regular rhythm and normal heart sounds.  Exam reveals no gallop and no friction rub.   No murmur heard. Pulmonary/Chest: Effort normal and breath sounds normal. No respiratory distress. He has no wheezes.  Abdominal: Soft. He exhibits no distension. There is no tenderness.  Musculoskeletal: Normal range of motion.  Left lumbar paraspinal muscles tender to palpation, no bony tenderness, step-offs, or gross abnormality or deformity of spine, patient is able to ambulate, moves all extremities  Bilateral great toe extension intact Bilateral plantar/dorsiflexion intact  Neurological: He is alert and oriented to person, place, and time.  Sensation and strength intact bilaterally Symmetrical  reflexes  Skin: Skin is warm. He is not diaphoretic.  Psychiatric: He has a normal mood and affect. His behavior is normal. Judgment and thought content normal.  Nursing note and vitals reviewed.   ED Course  Procedures (including critical care time)  Imaging Review Dg Lumbar Spine Complete  03/17/2016  CLINICAL DATA:  Worsening low back pain over 3 days. No known trauma. EXAM: LUMBAR SPINE - COMPLETE 4+ VIEW COMPARISON:  None. FINDINGS: Degenerative changes throughout the lumbar spine with narrowed interspaces and endplate osteophytes. Bridging osteophytes in the thoracolumbar junction and lower lumbar spine. Degenerative changes in the posterior facet joints. No vertebral compression deformities. No focal bone lesion or bone destruction. Vascular calcifications. Visualized sacrum appears intact. IMPRESSION: Degenerative changes in the lumbar spine with prominent endplate osteophyte formation. No acute displaced fractures identified. Electronically Signed   By: Marisa Cyphers.D.  On: 03/17/2016 06:00   I have personally reviewed and evaluated these images and lab results as part of my medical decision-making.   MDM   Final diagnoses:  Muscle spasm of back    Patient with low back spasms.  Normal sensation and strength.  Patient in too much pain to ambulate.  Will treat pain and reassess.    7:10 AM Patient feeling improved with treatment.  Patient seen by and discussed with Dr. Nicanor AlconPalumbo, who agrees with discharge plan.  Patient is stable and ready for discharge.  7:55 AM Patient attempted to get up to walk.  Still having some pain.  Will give some ibuprofen and encouraging good body mechanics to minimize lumbar flexion when going from supine to sitting.  9:02 AM Observed patient standing and doing a few mini-squats at the bedside.  Still has concern about pain.  Encouraged patient that there will not be an immediate fix, but that symptoms and mobility should return with treatment.   Discussed with Dr. Verdie MosherLiu, who agrees that if patient is standing and doing small squats he can be discharged.    Roxy Horsemanobert Morley Gaumer, PA-C 03/17/16 0711  Roxy Horsemanobert Danh Bayus, PA-C 03/17/16 [id number]  Cy BlamerApril Palumbo, MD 03/17/16 2356

## 2016-03-17 NOTE — ED Notes (Signed)
Patient transported to X-ray 

## 2016-06-30 ENCOUNTER — Encounter: Payer: Self-pay | Admitting: Cardiovascular Disease

## 2016-08-05 NOTE — Progress Notes (Signed)
Patient ID: Larry Hodges, male   DOB: [birth year], 70 y.o.   MRN: [id number] Larry Hodges is a  70 y.o.  male who returns for f/u CAD   70 y.o. with  a history of DM2, HTN, HL, CAD. 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 He establish with me  05/07/12. He denied chest pain but did have some exertional dyspnea. Follow up Myoview performed 05/14/12 was abnormal: inferior and apical defect consistent with prior inferior and apical infarct and mild peri-infarct ischemia, EF 52%. Therefore, cardiac catheterization was recommended.   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 Isosorbide 30 mg daily given for his myocardial bridging and evidence of diffuse distal diabetic disease on his cardiac catheterization .  The patient denies chest pain, shortness of breath, syncope, orthopnea, PND or significant pedal edema. Tolerating isosorbide well. 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    Invokana stopped and on jardiance   Has what sounds like neuropathic pain in both feet.     ROS: Denies fever, malais, weight loss, blurry vision, decreased visual acuity, cough, sputum, SOB, hemoptysis, pleuritic pain, palpitaitons, heartburn, abdominal pain, melena, lower extremity edema, claudication, or rash.  All other systems reviewed and negative  General: Affect appropriate Obese whtie male  HEENT: normal Neck supple with no adenopathy JVP normal left  bruits no thyromegaly Lungs clear with no wheezing and good diaphragmatic motion Heart:  S1/S2 no murmur, no rub, gallop  or click PMI normal Abdomen: benighn, BS positve, no tenderness, no AAA S/P lap umbilical hernia repair  no bruit.  No HSM or HJR Distal pulses intact with no bruits No edema Neuro non-focal Skin warm and dry No muscular weakness   Current Outpatient Prescriptions  Medication Sig Dispense Refill  . aspirin 81 MG tablet Take 81 mg by mouth daily.    . Cholecalciferol (VITAMIN D3) 50000 UNITS CAPS Take 50,000 Units by mouth once a week.    . clopidogrel (PLAVIX) 75 MG tablet Take 75 mg by mouth daily.    . cyanocobalamin (,VITAMIN B-12,) 1000 MCG/ML injection Inject 1,000 mcg into the muscle every 30 (thirty) days. 1st of each month    . empagliflozin (JARDIANCE) 25 MG TABS tablet Take 25 mg by mouth daily.    . hydrochlorothiazide (HYDRODIURIL) 25 MG tablet Take 25 mg by mouth daily.    . isosorbide mononitrate (IMDUR) 30 MG 24 hr tablet Take 1 tablet (30 mg total) by mouth daily. 90 tablet 2  . lisinopril (PRINIVIL,ZESTRIL) 30 MG tablet Take 30 mg by mouth daily.     . magnesium oxide (MAG-OX) 400 MG tablet Take 400 mg by mouth daily.    . metFORMIN (GLUCOPHAGE) 1000 MG tablet Take 1,000 mg by mouth 2 (two) times daily with a meal.    . metoprolol (LOPRESSOR) 50 MG tablet Take 50 mg by mouth 2 (two) times daily.    . nitroGLYCERIN (NITROSTAT) 0.4 MG SL tablet Place 0.4 mg under the tongue every 5 (five) minutes as needed for chest  pain (3 doses max).    . pravastatin (PRAVACHOL) 40 MG tablet Take 40 mg by mouth daily.    . tamsulosin (FLOMAX) 0.4 MG CAPS capsule Take 0.4 mg by mouth daily.     No current facility-administered medications for this visit.     Allergies  Review of patient's allergies indicates no known allergies.  Electrocardiogram:   09/15/14   SR PAC LAD 03/16/15  SR rate 67 LAD IVCD  08/08/16  SR rate 56 LAD IVCD Lateral J point elevation I, AVL   Assessment and Plan CAD:  Last cath 3/13 non obstructive diffuse distal diabetic disease and distal LAD bridging  continue medical Rx  Obesity:  Discussed exercise and low carb diet  Chol:  On statin Cholesterol is at goal.  Continue current dose of statin and diet Rx.  No myalgias or side effects.  F/U  LFT's in 6 months. [facility]  Edema:  Dependant from obesity  Continue diuretic   HTN:  Well controlled.  Continue current medications and low sodium Dash type diet.    Carotid:  Left bruit 04/2015 40-59% RICA  disease f/u duplex in March 2018  Foot Pain:  Will order ABI's and LE arterial duplex given known vascular dx. Start lyrica 25 tid Cr has been high in past at 1.4 will check BMET / GFR today.  F/u primary         F/U with me in 6 months   Charlton HawsPeter Pablo Stauffer

## 2016-08-08 ENCOUNTER — Encounter: Payer: Self-pay | Admitting: Cardiovascular Disease

## 2016-08-08 ENCOUNTER — Encounter (INDEPENDENT_AMBULATORY_CARE_PROVIDER_SITE_OTHER): Payer: Self-pay

## 2016-08-08 ENCOUNTER — Ambulatory Visit (INDEPENDENT_AMBULATORY_CARE_PROVIDER_SITE_OTHER): Payer: Medicare HMO | Admitting: Cardiovascular Disease

## 2016-08-08 VITALS — BP 116/54 | HR 56 | Ht 66.0 in | Wt 243.8 lb

## 2016-08-08 DIAGNOSIS — R0989 Other specified symptoms and signs involving the circulatory and respiratory systems: Secondary | ICD-10-CM

## 2016-08-08 DIAGNOSIS — I6529 Occlusion and stenosis of unspecified carotid artery: Secondary | ICD-10-CM | POA: Diagnosis not present

## 2016-08-08 DIAGNOSIS — E782 Mixed hyperlipidemia: Secondary | ICD-10-CM | POA: Diagnosis not present

## 2016-08-08 LAB — BASIC METABOLIC PANEL
BUN: 24 mg/dL (ref 7–25)
CHLORIDE: 106 mmol/L (ref 98–110)
CO2: 27 mmol/L (ref 20–31)
Calcium: 9.8 mg/dL (ref 8.6–10.3)
Creat: 1.15 mg/dL (ref 0.70–1.25)
Glucose, Bld: 104 mg/dL — ABNORMAL HIGH (ref 65–99)
Potassium: 4.2 mmol/L (ref 3.5–5.3)
SODIUM: 140 mmol/L (ref 135–146)

## 2016-08-08 MED ORDER — PREGABALIN 25 MG PO CAPS: 25.0000 mg | ORAL_CAPSULE | Freq: Three times a day (TID) | ORAL | 11 refills | Status: DC

## 2016-08-08 NOTE — Patient Instructions (Addendum)
Medication Instructions:  Your physician has recommended you make the following change in your medication:  1-START Lyrica 25 mg by mouth three times a day.  Labwork: Your physician recommends that you have lab work today. BMET   Testing/Procedures: Your physician has requested that you have a carotid duplex in March 2018. This test is an ultrasound of the carotid arteries in your neck. It looks at blood flow through these arteries that supply the brain with blood. Allow one hour for this exam. There are no restrictions or special instructions.  Your physician has requested that you have a lower extremity arterial duplex with ABI.   Follow-Up: Your physician wants you to follow-up in: 6 months with Dr. Eden EmmsNishan. You will receive a reminder letter in the mail two months in advance. If you don't receive a letter, please call our office to schedule the follow-up appointment.   If you need a refill on your cardiac medications before your next appointment, please call your pharmacy.

## 2016-08-15 ENCOUNTER — Telehealth: Payer: Self-pay | Admitting: Cardiovascular Disease

## 2016-08-15 NOTE — Telephone Encounter (Signed)
Left message for patient to call back  

## 2016-08-15 NOTE — Telephone Encounter (Signed)
Mr. Larry Hodges is returning a call . Please call .  Thanks

## 2016-08-17 ENCOUNTER — Ambulatory Visit (HOSPITAL_COMMUNITY)
Admission: RE | Admit: 2016-08-17 | Discharge: 2016-08-17 | Disposition: A | Discharge status: Home / Self Care | Payer: Medicare HMO | Source: Ambulatory Visit | Attending: Internal Medicine | Admitting: Internal Medicine

## 2016-08-17 ENCOUNTER — Other Ambulatory Visit: Payer: Self-pay | Admitting: Cardiovascular Disease

## 2016-08-17 DIAGNOSIS — I6529 Occlusion and stenosis of unspecified carotid artery: Secondary | ICD-10-CM

## 2016-08-17 DIAGNOSIS — I2510 Atherosclerotic heart disease of native coronary artery without angina pectoris: Secondary | ICD-10-CM | POA: Diagnosis not present

## 2016-08-17 DIAGNOSIS — E1151 Type 2 diabetes mellitus with diabetic peripheral angiopathy without gangrene: Secondary | ICD-10-CM | POA: Insufficient documentation

## 2016-08-17 DIAGNOSIS — E785 Hyperlipidemia, unspecified: Secondary | ICD-10-CM | POA: Diagnosis not present

## 2016-08-17 DIAGNOSIS — E782 Mixed hyperlipidemia: Secondary | ICD-10-CM | POA: Diagnosis not present

## 2016-08-17 DIAGNOSIS — I739 Peripheral vascular disease, unspecified: Secondary | ICD-10-CM

## 2016-08-17 DIAGNOSIS — I10 Essential (primary) hypertension: Secondary | ICD-10-CM | POA: Insufficient documentation

## 2016-08-18 NOTE — Telephone Encounter (Signed)
Patient aware of BLE duplex results.

## 2017-02-06 ENCOUNTER — Ambulatory Visit (HOSPITAL_COMMUNITY)
Admission: RE | Admit: 2017-02-06 | Discharge: 2017-02-06 | Disposition: A | Discharge status: Home / Self Care | Payer: Medicare HMO | Source: Ambulatory Visit | Attending: Cardiology | Admitting: Cardiology

## 2017-02-06 DIAGNOSIS — I2510 Atherosclerotic heart disease of native coronary artery without angina pectoris: Secondary | ICD-10-CM | POA: Insufficient documentation

## 2017-02-06 DIAGNOSIS — I6523 Occlusion and stenosis of bilateral carotid arteries: Secondary | ICD-10-CM | POA: Insufficient documentation

## 2017-02-06 DIAGNOSIS — E119 Type 2 diabetes mellitus without complications: Secondary | ICD-10-CM | POA: Diagnosis not present

## 2017-02-06 DIAGNOSIS — E785 Hyperlipidemia, unspecified: Secondary | ICD-10-CM | POA: Diagnosis not present

## 2017-02-06 DIAGNOSIS — I10 Essential (primary) hypertension: Secondary | ICD-10-CM | POA: Insufficient documentation

## 2017-02-06 DIAGNOSIS — R0989 Other specified symptoms and signs involving the circulatory and respiratory systems: Secondary | ICD-10-CM | POA: Diagnosis not present

## 2017-02-06 DIAGNOSIS — Z8673 Personal history of transient ischemic attack (TIA), and cerebral infarction without residual deficits: Secondary | ICD-10-CM | POA: Insufficient documentation

## 2017-02-08 ENCOUNTER — Other Ambulatory Visit: Payer: Self-pay | Admitting: Cardiovascular Disease

## 2017-02-21 NOTE — Progress Notes (Signed)
Patient ID: Larry Hodges, male   DOB: 05/02/1946, 71 y.o.   MRN: [id number] Larry Hodges is a  71 y.o.  male who returns for f/u CAD   71 y.o. with  a history of DM2, HTN, HL, CAD. 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 He establish with me  05/07/12. He denied chest pain but did have some exertional dyspnea. Follow up Myoview performed 05/14/12 was abnormal: inferior and apical defect consistent with prior inferior and apical infarct and mild peri-infarct ischemia, EF 52%. Therefore, cardiac catheterization was recommended.   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 Isosorbide 30 mg daily given for his myocardial bridging and evidence of diffuse distal diabetic disease on his cardiac catheterization .  The patient denies chest pain, shortness of breath, syncope, orthopnea, PND or significant pedal edema. Tolerating isosorbide well. 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    Invokana stopped and on jardiance   Has what sounds like neuropathic pain in both feet.  Lyrica started last visit with normal ABI"S   Need Colonoscopy routine with Dr Chales Abrahams   ROS: Denies fever, malais, weight loss, blurry vision, decreased visual acuity, cough, sputum, SOB, hemoptysis, pleuritic pain, palpitaitons, heartburn, abdominal pain, melena, lower extremity edema, claudication, or rash.  All other systems reviewed and negative  General: Affect appropriate Obese whtie male  HEENT: normal Neck supple with no adenopathy JVP normal left  bruits no  thyromegaly Lungs clear with no wheezing and good diaphragmatic motion Heart:  S1/S2 no murmur, no rub, gallop or click PMI normal Abdomen: benighn, BS positve, no tenderness, no AAA S/P lap umbilical hernia repair  no bruit.  No HSM or HJR Distal pulses intact with no bruits No edema Neuro non-focal Skin warm and dry No muscular weakness   Current Outpatient Prescriptions  Medication Sig Dispense Refill  . aspirin 81 MG tablet Take 81 mg by mouth daily.    . Cholecalciferol (VITAMIN D3) 50000 UNITS CAPS Take 50,000 Units by mouth once a week.    . clopidogrel (PLAVIX) 75 MG tablet Take 75 mg by mouth daily.    . cyanocobalamin (,VITAMIN B-12,) 1000 MCG/ML injection Inject 1,000 mcg into the muscle every 30 (thirty) days. 1st of each month    . gabapentin (NEURONTIN) 100 MG capsule TAKE 1 TAB BY MOUTH IN THE AM & 2 TABS BY MOUTH IN THE PM.    . hydrochlorothiazide (HYDRODIURIL) 25 MG tablet Take 25 mg by mouth daily.    . isosorbide mononitrate (IMDUR) 30 MG 24 hr tablet Take 1 tablet (30 mg total) by mouth daily. 90 tablet 1  . lisinopril (PRINIVIL,ZESTRIL) 30 MG tablet Take 30 mg by mouth daily.     . magnesium oxide (MAG-OX) 400 MG tablet Take 400 mg by mouth daily.    . metFORMIN (GLUCOPHAGE) 1000 MG tablet Take 1,000 mg by mouth 2 (two) times daily with a meal.    . metoprolol (LOPRESSOR) 50 MG tablet Take 50 mg by mouth 2 (two)  times daily.    . nitroGLYCERIN (NITROSTAT) 0.4 MG SL tablet Place 0.4 mg under the tongue every 5 (five) minutes as needed for chest pain (3 doses max).    . pravastatin (PRAVACHOL) 40 MG tablet Take 40 mg by mouth daily.    . tamsulosin (FLOMAX) 0.4 MG CAPS capsule Take 0.4 mg by mouth daily.     No current facility-administered medications for this visit.     Allergies  Patient has no known allergies.  Electrocardiogram:   09/15/14   SR PAC LAD 03/16/15  SR rate 67 LAD IVCD  08/08/16  SR rate 56 LAD IVCD Lateral J point elevation I, AVL    Assessment and Plan CAD:  Last cath 3/13 non obstructive diffuse distal diabetic disease and distal LAD bridging continue medical Rx  Obesity:  Discussed exercise and low carb diet  Chol:  On statin Cholesterol is at goal.  Continue current dose of statin and diet Rx.  No myalgias or side effects.  F/U  LFT's in 6 months. [facility]  Edema:  Dependant from obesity  Continue diuretic   HTN:  Well controlled.  Continue current medications and low sodium Dash type diet.    Carotid:   Reviewed duplex 02/06/17 plaque no stenosis f/u duplex March 2019   Foot Pain:  Started on lyrica. Reviewed ABI's 08/17/16 normal   GI:  Ok to have colonoscopy with Dr Chales AbrahamsGupta stop plavix and asa 5 days before         F/U with me in 6 months   Larry Hodges

## 2017-02-24 ENCOUNTER — Encounter: Payer: Self-pay | Admitting: Cardiovascular Disease

## 2017-02-24 ENCOUNTER — Ambulatory Visit (INDEPENDENT_AMBULATORY_CARE_PROVIDER_SITE_OTHER): Payer: Medicare HMO | Admitting: Cardiovascular Disease

## 2017-02-24 VITALS — BP 136/58 | HR 55 | Ht 67.0 in | Wt 246.0 lb

## 2017-02-24 DIAGNOSIS — I2510 Atherosclerotic heart disease of native coronary artery without angina pectoris: Secondary | ICD-10-CM

## 2017-02-24 DIAGNOSIS — I10 Essential (primary) hypertension: Secondary | ICD-10-CM

## 2017-02-24 DIAGNOSIS — I2583 Coronary atherosclerosis due to lipid rich plaque: Secondary | ICD-10-CM | POA: Diagnosis not present

## 2017-02-24 NOTE — Patient Instructions (Signed)

## 2017-09-19 IMAGING — CR DG LUMBAR SPINE COMPLETE 4+V
5 series · 5 of 5 positions shown · non-contrast
Comparison: None.

CLINICAL DATA: Worsening low back pain over 3 days. No known
trauma.

EXAM:
LUMBAR SPINE - COMPLETE 4+ VIEW

[t lumbar spine ap]
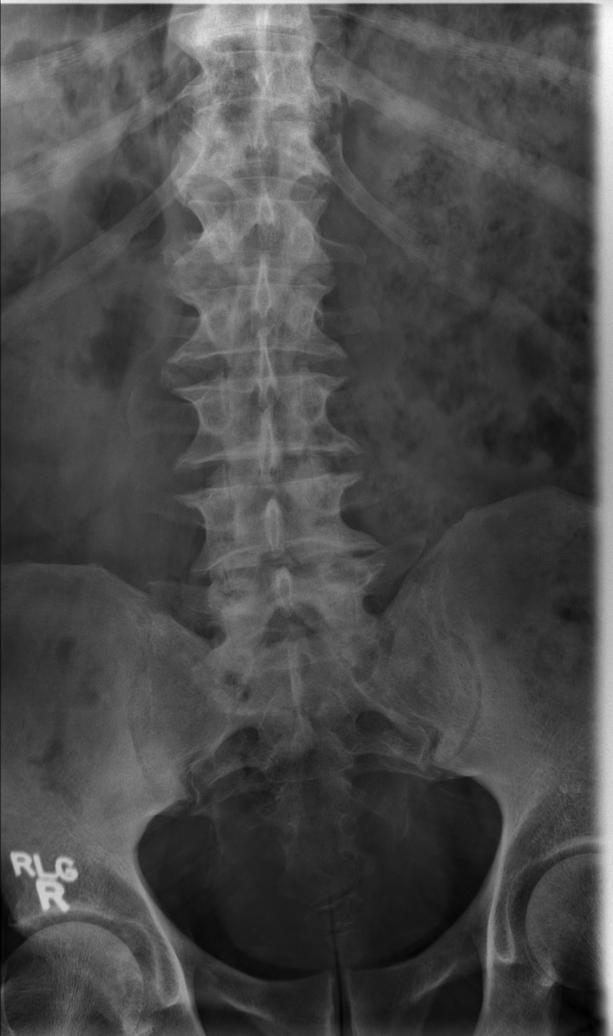

[t lumbar spine obl (1 of 2)]
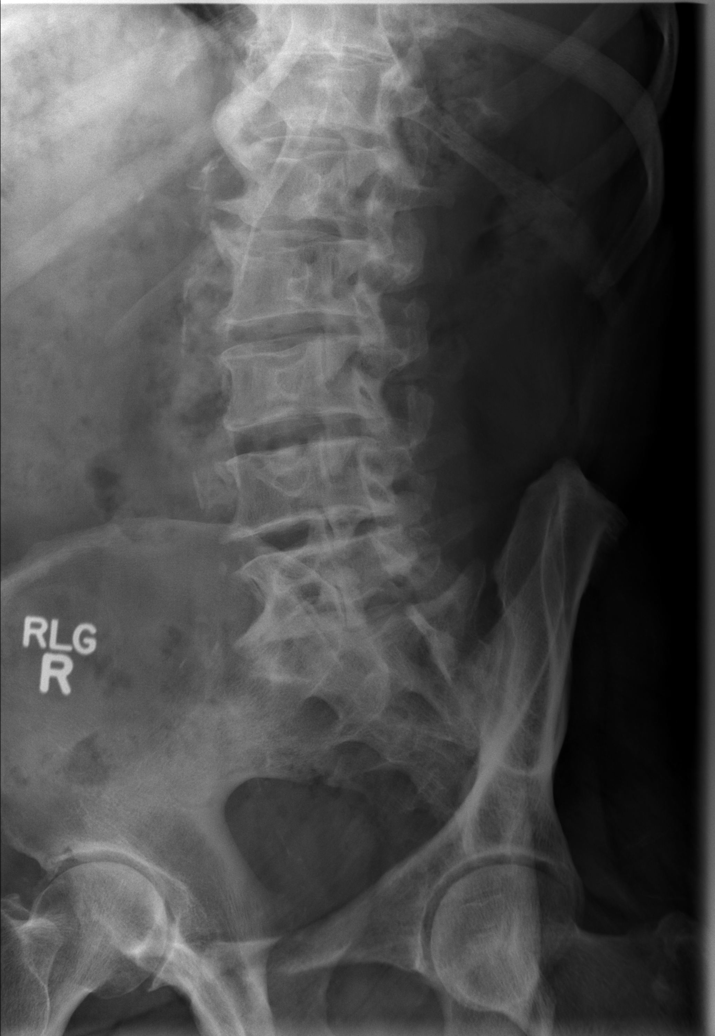

[t lumbar spine obl (2 of 2)]
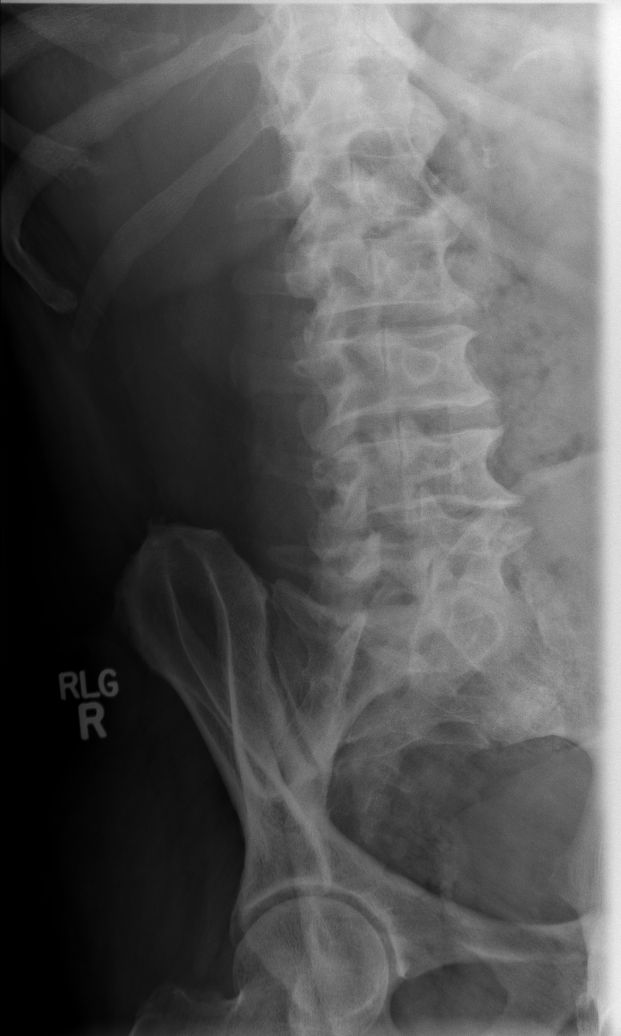

[t lumbar spine lat]
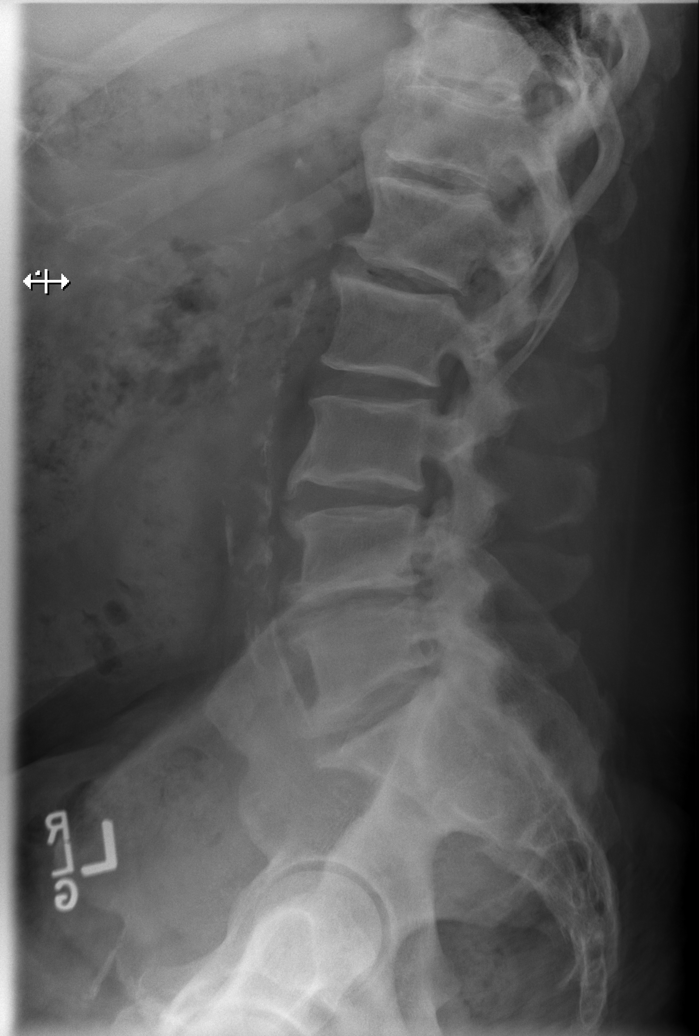

[t lumbar l-5 s-1 spot]
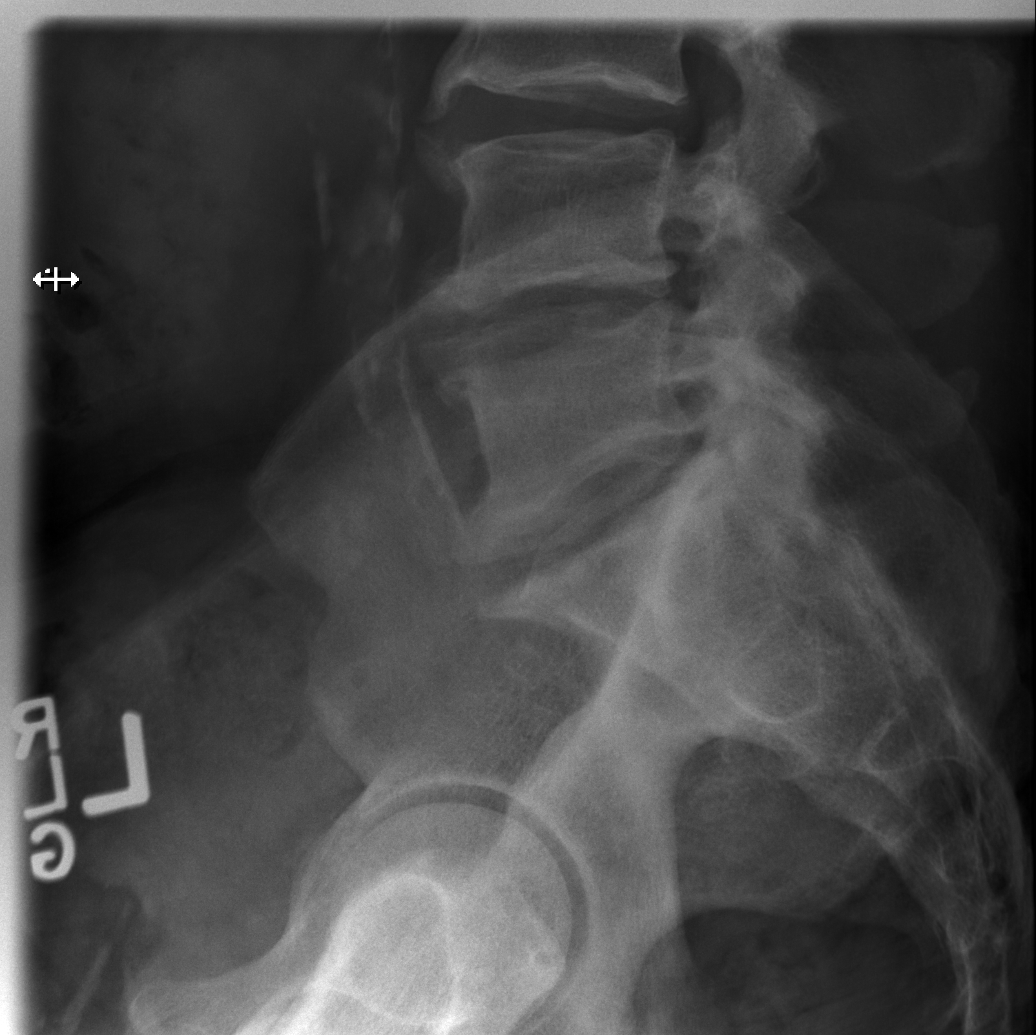

[5 of 5 positions shown; findings below may reference images not displayed]

FINDINGS: Degenerative changes throughout the lumbar spine with narrowed
interspaces and endplate osteophytes. Bridging osteophytes in the
thoracolumbar junction and lower lumbar spine. Degenerative changes
in the posterior facet joints. No vertebral compression deformities.
No focal bone lesion or bone destruction. Vascular calcifications.
Visualized sacrum appears intact.
IMPRESSION: Degenerative changes in the lumbar spine with prominent endplate
osteophyte formation. No acute displaced fractures identified.

## 2017-09-25 NOTE — Progress Notes (Signed)
Patient ID: Larry Hodges, male   DOB: 1945/12/18, 71 y.o.   MRN: [id number] Larry Hodges is a  71 y.o.  male who returns for f/u CAD   71 y.o. with  a history of DM2, HTN, HL, CAD. 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 He establish with me  05/07/12. He denied chest pain but did have some exertional dyspnea. Follow up Myoview performed 05/14/12 was abnormal: inferior and apical defect consistent with prior inferior and apical infarct and mild peri-infarct ischemia, EF 52%. Therefore, cardiac catheterization was recommended.   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 Isosorbide 30 mg daily given for his myocardial bridging and evidence of diffuse distal diabetic disease on his cardiac catheterization .  The patient denies chest pain, shortness of breath, syncope, orthopnea, PND or significant pedal edema. Tolerating isosorbide well. A1C about 6.3 in June   06/13/14  umbilical hernia repair without complications   Enjoys tractor pulls does it with son  Has lots of trophies   Last carotid reviewed March 2018 with new criteria plaque no stenosis    DM:  Invokana stopped and on jardiance   Has what sounds like neuropathic pain in both feet.  Lyrica started last visit with normal ABI"S   Labs from August reviewed showed Cr 1.46 BNP normal 115 Hct 38.4    ROS: Denies fever, malais, weight loss, blurry vision, decreased visual acuity, cough, sputum, SOB, hemoptysis, pleuritic pain, palpitaitons, heartburn, abdominal pain, melena, lower extremity edema, claudication, or rash.  All other systems reviewed and negative  General: Affect appropriate Obese whtie male ? Cushingoid appearing  HEENT: normal Neck supple with no adenopathy JVP normal left  bruits no thyromegaly Lungs clear with no wheezing and good diaphragmatic motion Heart:   S1/S2 no murmur, no rub, gallop or click PMI normal Abdomen: benighn, BS positve, no tenderness, no AAA S/P lap umbilical hernia repair  no bruit.  No HSM or HJR Distal pulses intact with no bruits Plus 2 RLE and plus one LLE edema Neuro non-focal Skin warm and dry No muscular weakness   Current Outpatient Prescriptions  Medication Sig Dispense Refill  . acetaminophen (TYLENOL) 500 MG tablet Take 500 mg by mouth every 6 (six) hours as needed.    Marland Kitchen. albuterol (PROVENTIL HFA;VENTOLIN HFA) 108 (90 Base) MCG/ACT inhaler Inhale 2 puffs into the lungs every 4 (four) hours as needed for wheezing or shortness of breath.    Marland Kitchen. aspirin 81 MG tablet Take 81 mg by mouth daily.    . Cholecalciferol (VITAMIN D3) 50000 UNITS CAPS Take 50,000 Units by mouth once a week.    . clopidogrel (PLAVIX) 75 MG tablet Take 75 mg by mouth daily.    . cyanocobalamin (,VITAMIN B-12,) 1000 MCG/ML injection Inject 1,000 mcg into the muscle every 30 (thirty) days. 1st of each month    . furosemide (LASIX) 20 MG tablet Take 20 mg by mouth daily.    Marland Kitchen. gabapentin (NEURONTIN) 100 MG capsule TAKE 1 TAB BY MOUTH IN THE AM & 2 TABS BY MOUTH IN THE PM.    . isosorbide mononitrate (IMDUR) 30 MG 24 hr tablet Take 1 tablet (30 mg total) by mouth daily. 90 tablet 1  . lisinopril (PRINIVIL,ZESTRIL) 30 MG tablet Take 30 mg by mouth daily.     . magnesium oxide (MAG-OX) 400 MG  tablet Take 400 mg by mouth daily.    . metFORMIN (GLUCOPHAGE) 1000 MG tablet Take 1,000 mg by mouth 2 (two) times daily with a meal.    . metoprolol (LOPRESSOR) 50 MG tablet Take 50 mg by mouth 2 (two) times daily.    . nitroGLYCERIN (NITROSTAT) 0.4 MG SL tablet Place 0.4 mg under the tongue every 5 (five) minutes as needed for chest pain (3 doses max).    . pravastatin (PRAVACHOL) 40 MG tablet Take 40 mg by mouth daily.    . ranitidine (ZANTAC) 150 MG tablet Take 150 mg by mouth 2 (two) times daily.    . tamsulosin (FLOMAX) 0.4 MG CAPS capsule Take 0.4 mg by  mouth daily.    Marland Kitchen telmisartan (MICARDIS) 80 MG tablet Take 80 mg by mouth daily.     No current facility-administered medications for this visit.     Allergies  Patient has no known allergies.  Electrocardiogram:   09/15/14   SR PAC LAD 03/16/15  SR rate 67 LAD IVCD  08/08/16  SR rate 56 LAD IVCD Lateral J point elevation I, AVL 09/26/17 SR rate 60 LAD Nonspecific ST changes   Assessment and Plan CAD:  Last cath 3/13 non obstructive diffuse distal diabetic disease and distal LAD bridging continue medical Rx  Obesity:  Discussed exercise and low carb diet  Chol:  On statin Cholesterol is at goal.  Continue current dose of statin and diet Rx.  No myalgias or side effects.  Labs followed by primary   Edema:  Dependant from obesity  Increase lasix to 40 am and 20 pm BMET/BNP in 3 weeks  HTN:  Well controlled.  Continue current medications and low sodium Dash type diet.    Carotid:   Reviewed duplex 02/06/17 plaque no stenosis f/u duplex March 2019   Foot Pain:  Started on lyrica. Reviewed ABI's 08/17/16 normal   DM:  Discussed low carb diet.  Target hemoglobin A1c is 6.5 or less.  Continue current medications.          F/U with me in 6 months   [name]

## 2017-09-26 ENCOUNTER — Ambulatory Visit (INDEPENDENT_AMBULATORY_CARE_PROVIDER_SITE_OTHER): Payer: Medicare HMO | Admitting: Cardiovascular Disease

## 2017-09-26 VITALS — BP 110/66 | HR 80 | Ht 66.0 in | Wt 235.2 lb

## 2017-09-26 DIAGNOSIS — R609 Edema, unspecified: Secondary | ICD-10-CM | POA: Diagnosis not present

## 2017-09-26 DIAGNOSIS — I10 Essential (primary) hypertension: Secondary | ICD-10-CM

## 2017-09-26 DIAGNOSIS — I2510 Atherosclerotic heart disease of native coronary artery without angina pectoris: Secondary | ICD-10-CM | POA: Diagnosis not present

## 2017-09-26 DIAGNOSIS — I2583 Coronary atherosclerosis due to lipid rich plaque: Secondary | ICD-10-CM

## 2017-09-26 MED ORDER — FUROSEMIDE 20 MG PO TABS: ORAL_TABLET | ORAL | 11 refills | Status: AC

## 2017-09-26 NOTE — Patient Instructions (Addendum)
Medication Instructions:  Your physician has recommended you make the following change in your medication:  1-Increase Lasix  Take 40 mg (2 tablets) by mouth in the AM and Take 20 mg by mouth in the PM.  Labwork: Your physician recommends that you return for lab work in: 3 weeks for BMET  Testing/Procedures: NONE  Follow-Up: Your physician wants you to follow-up in: 6 months with Dr. Eden EmmsNishan. You will receive a reminder letter in the mail two months in advance. If you don't receive a letter, please call our office to schedule the follow-up appointment.   If you need a refill on your cardiac medications before your next appointment, please call your pharmacy.

## 2017-10-17 ENCOUNTER — Other Ambulatory Visit: Payer: Medicare HMO | Admitting: *Deleted

## 2017-10-17 DIAGNOSIS — R609 Edema, unspecified: Secondary | ICD-10-CM

## 2017-10-18 LAB — BASIC METABOLIC PANEL
BUN/Creatinine Ratio: 16 (ref 10–24)
BUN: 20 mg/dL (ref 8–27)
CALCIUM: 10.3 mg/dL — AB (ref 8.6–10.2)
CHLORIDE: 103 mmol/L (ref 96–106)
CO2: 26 mmol/L (ref 20–29)
Creatinine, Ser: 1.28 mg/dL — ABNORMAL HIGH (ref 0.76–1.27)
GFR calc non Af Amer: 57 mL/min/{1.73_m2} — ABNORMAL LOW (ref >59)
GFR, EST AFRICAN AMERICAN: 66 mL/min/{1.73_m2} (ref >59)
GLUCOSE: 114 mg/dL — AB (ref 65–99)
POTASSIUM: 4.8 mmol/L (ref 3.5–5.2)
Sodium: 143 mmol/L (ref 134–144)

## 2022-04-28 ENCOUNTER — Encounter: Payer: Self-pay | Admitting: Gastroenterology
